# Patient Record
Sex: Female | Born: 1945 | Race: White | Hispanic: No | Marital: Married
Health system: Southern US, Community
[De-identification: ages and names within clinical notes are randomized; demographics above are authoritative.]

## PROBLEM LIST (undated history)

## (undated) DIAGNOSIS — E559 Vitamin D deficiency, unspecified: Secondary | ICD-10-CM

## (undated) DIAGNOSIS — E785 Hyperlipidemia, unspecified: Secondary | ICD-10-CM

## (undated) DIAGNOSIS — M1990 Unspecified osteoarthritis, unspecified site: Secondary | ICD-10-CM

## (undated) DIAGNOSIS — F410 Panic disorder [episodic paroxysmal anxiety] without agoraphobia: Secondary | ICD-10-CM

## (undated) DIAGNOSIS — C801 Malignant (primary) neoplasm, unspecified: Secondary | ICD-10-CM

## (undated) DIAGNOSIS — I10 Essential (primary) hypertension: Secondary | ICD-10-CM

## (undated) DIAGNOSIS — M1712 Unilateral primary osteoarthritis, left knee: Secondary | ICD-10-CM

## (undated) DIAGNOSIS — F32A Depression, unspecified: Secondary | ICD-10-CM

## (undated) DIAGNOSIS — N183 Chronic kidney disease, stage 3 unspecified: Secondary | ICD-10-CM

## (undated) DIAGNOSIS — K219 Gastro-esophageal reflux disease without esophagitis: Secondary | ICD-10-CM

## (undated) HISTORY — PX: TONSILLECTOMY: SUR1361

## (undated) HISTORY — PX: EYE SURGERY: SHX253

## (undated) HISTORY — PX: ABDOMINAL HYSTERECTOMY: SHX81

## (undated) HISTORY — PX: ANKLE SURGERY: SHX546

---
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
## 1968-09-05 HISTORY — PX: ABDOMINAL HYSTERECTOMY: SHX81

## 2003-09-06 DIAGNOSIS — C4490 Unspecified malignant neoplasm of skin, unspecified: Secondary | ICD-10-CM

## 2003-09-06 DIAGNOSIS — C801 Malignant (primary) neoplasm, unspecified: Secondary | ICD-10-CM

## 2003-09-06 HISTORY — DX: Malignant (primary) neoplasm, unspecified: C80.1

## 2003-09-06 HISTORY — DX: Unspecified malignant neoplasm of skin, unspecified: C44.90

## 2004-06-11 ENCOUNTER — Ambulatory Visit: Payer: Self-pay | Admitting: Internal Medicine

## 2005-06-13 ENCOUNTER — Ambulatory Visit: Payer: Self-pay | Admitting: Internal Medicine

## 2005-07-31 ENCOUNTER — Emergency Department: Payer: Self-pay | Admitting: Emergency Medicine

## 2005-07-31 ENCOUNTER — Other Ambulatory Visit: Payer: Self-pay

## 2005-08-03 ENCOUNTER — Inpatient Hospital Stay: Payer: Self-pay | Admitting: Psychiatry

## 2005-08-14 ENCOUNTER — Emergency Department: Payer: Self-pay | Admitting: General Practice

## 2005-08-14 ENCOUNTER — Other Ambulatory Visit: Payer: Self-pay

## 2005-08-19 ENCOUNTER — Other Ambulatory Visit: Payer: Self-pay

## 2005-08-19 ENCOUNTER — Inpatient Hospital Stay: Payer: Self-pay | Admitting: Internal Medicine

## 2005-08-31 ENCOUNTER — Ambulatory Visit: Payer: Self-pay | Admitting: Psychiatry

## 2005-09-05 ENCOUNTER — Ambulatory Visit: Payer: Self-pay | Admitting: Psychiatry

## 2006-06-16 ENCOUNTER — Ambulatory Visit: Payer: Self-pay | Admitting: Internal Medicine

## 2007-03-19 ENCOUNTER — Ambulatory Visit: Payer: Self-pay | Admitting: Psychiatry

## 2007-03-19 ENCOUNTER — Other Ambulatory Visit: Payer: Self-pay

## 2007-06-20 ENCOUNTER — Ambulatory Visit: Payer: Self-pay | Admitting: Internal Medicine

## 2007-09-06 HISTORY — PX: ANKLE SURGERY: SHX546

## 2007-09-06 HISTORY — PX: BUNIONECTOMY: SHX129

## 2008-09-10 ENCOUNTER — Ambulatory Visit: Payer: Self-pay | Admitting: Internal Medicine

## 2009-09-05 HISTORY — PX: BUNIONECTOMY: SHX129

## 2009-09-30 ENCOUNTER — Ambulatory Visit: Payer: Self-pay | Admitting: Internal Medicine

## 2009-11-12 ENCOUNTER — Ambulatory Visit: Payer: Self-pay | Admitting: Podiatry

## 2011-01-25 ENCOUNTER — Ambulatory Visit: Payer: Self-pay | Admitting: Internal Medicine

## 2011-09-06 HISTORY — PX: CATARACT EXTRACTION, BILATERAL: SHX1313

## 2012-01-26 ENCOUNTER — Ambulatory Visit: Payer: Self-pay | Admitting: Internal Medicine

## 2012-07-31 ENCOUNTER — Ambulatory Visit: Payer: Self-pay | Admitting: Ophthalmology

## 2012-08-22 ENCOUNTER — Ambulatory Visit: Payer: Self-pay | Admitting: Ophthalmology

## 2013-02-12 ENCOUNTER — Ambulatory Visit: Payer: Self-pay | Admitting: Internal Medicine

## 2014-02-13 ENCOUNTER — Ambulatory Visit: Payer: Self-pay | Admitting: Internal Medicine

## 2014-12-19 ENCOUNTER — Other Ambulatory Visit: Payer: Self-pay | Admitting: Internal Medicine

## 2014-12-19 DIAGNOSIS — Z1231 Encounter for screening mammogram for malignant neoplasm of breast: Secondary | ICD-10-CM

## 2015-02-16 ENCOUNTER — Other Ambulatory Visit: Payer: Self-pay | Admitting: Internal Medicine

## 2015-02-16 ENCOUNTER — Ambulatory Visit
Admission: RE | Admit: 2015-02-16 | Discharge: 2015-02-16 | Disposition: A | Discharge status: Home / Self Care | Payer: Medicare Other | Source: Ambulatory Visit | Attending: Internal Medicine | Admitting: Internal Medicine

## 2015-02-16 DIAGNOSIS — Z1231 Encounter for screening mammogram for malignant neoplasm of breast: Secondary | ICD-10-CM

## 2015-02-16 HISTORY — DX: Malignant (primary) neoplasm, unspecified: C80.1

## 2015-06-04 ENCOUNTER — Emergency Department
Admission: EM | Admit: 2015-06-04 | Discharge: 2015-06-05 | Disposition: A | Discharge status: Home / Self Care | Payer: Medicare Other | Attending: Emergency Medicine | Admitting: Emergency Medicine

## 2015-06-04 ENCOUNTER — Emergency Department: Payer: Medicare Other

## 2015-06-04 DIAGNOSIS — Z880 Allergy status to penicillin: Secondary | ICD-10-CM | POA: Diagnosis not present

## 2015-06-04 DIAGNOSIS — R1030 Lower abdominal pain, unspecified: Secondary | ICD-10-CM

## 2015-06-04 DIAGNOSIS — K5900 Constipation, unspecified: Secondary | ICD-10-CM | POA: Insufficient documentation

## 2015-06-04 DIAGNOSIS — R197 Diarrhea, unspecified: Secondary | ICD-10-CM | POA: Diagnosis present

## 2015-06-04 HISTORY — DX: Essential (primary) hypertension: I10

## 2015-06-04 HISTORY — DX: Panic disorder (episodic paroxysmal anxiety): F41.0

## 2015-06-04 LAB — COMPREHENSIVE METABOLIC PANEL
ALBUMIN: 4.2 g/dL (ref 3.5–5.0)
ALT: 18 U/L (ref 14–54)
AST: 26 U/L (ref 15–41)
Alkaline Phosphatase: 61 U/L (ref 38–126)
Anion gap: 9 (ref 5–15)
BUN: 10 mg/dL (ref 6–20)
CHLORIDE: 106 mmol/L (ref 101–111)
CO2: 24 mmol/L (ref 22–32)
CREATININE: 1.24 mg/dL — AB (ref 0.44–1.00)
Calcium: 9.5 mg/dL (ref 8.9–10.3)
GFR calc Af Amer: 49 mL/min — ABNORMAL LOW (ref >60)
GFR, EST NON AFRICAN AMERICAN: 43 mL/min — AB (ref >60)
GLUCOSE: 101 mg/dL — AB (ref 65–99)
POTASSIUM: 4.2 mmol/L (ref 3.5–5.1)
Sodium: 139 mmol/L (ref 135–145)
Total Bilirubin: 0.5 mg/dL (ref 0.3–1.2)
Total Protein: 7 g/dL (ref 6.5–8.1)

## 2015-06-04 LAB — CBC WITH DIFFERENTIAL/PLATELET
Basophils Absolute: 0.2 10*3/uL — ABNORMAL HIGH (ref 0–0.1)
Basophils Relative: 2 %
EOS PCT: 6 %
Eosinophils Absolute: 0.5 10*3/uL (ref 0–0.7)
HEMATOCRIT: 45 % (ref 35.0–47.0)
Hemoglobin: 15 g/dL (ref 12.0–16.0)
LYMPHS ABS: 2.2 10*3/uL (ref 1.0–3.6)
LYMPHS PCT: 25 %
MCH: 28 pg (ref 26.0–34.0)
MCHC: 33.3 g/dL (ref 32.0–36.0)
MCV: 84.1 fL (ref 80.0–100.0)
Monocytes Absolute: 0.8 10*3/uL (ref 0.2–0.9)
Monocytes Relative: 9 %
NEUTROS ABS: 5.2 10*3/uL (ref 1.4–6.5)
Neutrophils Relative %: 58 %
PLATELETS: 238 10*3/uL (ref 150–440)
RBC: 5.35 MIL/uL — ABNORMAL HIGH (ref 3.80–5.20)
RDW: 14 % (ref 11.5–14.5)
WBC: 9 10*3/uL (ref 3.6–11.0)

## 2015-06-04 LAB — URINALYSIS COMPLETE WITH MICROSCOPIC (ARMC ONLY)
BILIRUBIN URINE: NEGATIVE
Glucose, UA: NEGATIVE mg/dL
KETONES UR: NEGATIVE mg/dL
Nitrite: NEGATIVE
PH: 6 (ref 5.0–8.0)
Protein, ur: NEGATIVE mg/dL
Specific Gravity, Urine: 1.003 — ABNORMAL LOW (ref 1.005–1.030)

## 2015-06-04 LAB — LIPASE, BLOOD: Lipase: 28 U/L (ref 22–51)

## 2015-06-04 MED ORDER — IOHEXOL 300 MG/ML  SOLN
80.0000 mL | Freq: Once | INTRAMUSCULAR | Status: AC | PRN
  Administered 2015-06-04: 75 mL via INTRAVENOUS

## 2015-06-04 MED ORDER — ACETAMINOPHEN 325 MG PO TABS
650.0000 mg | ORAL_TABLET | Freq: Once | ORAL | Status: AC
  Administered 2015-06-04: 650 mg via ORAL

## 2015-06-04 MED ORDER — ACETAMINOPHEN 325 MG PO TABS
ORAL_TABLET | ORAL | Status: AC
  Filled 2015-06-04: qty 2

## 2015-06-04 MED ORDER — IOHEXOL 240 MG/ML SOLN
25.0000 mL | Freq: Once | INTRAMUSCULAR | Status: AC | PRN
  Administered 2015-06-04: 25 mL via ORAL

## 2015-06-04 NOTE — ED Notes (Signed)
Pt is done drinking contrast and clicked ready for CT

## 2015-06-04 NOTE — ED Provider Notes (Signed)
[facility] Emergency Department Provider Note  Time seen: 6:03 PM  I have reviewed the triage vital signs and the nursing notes.   HISTORY  Chief Complaint Diarrhea    HPI Tabitha Gill is a 69 y.o. female with a past medical history of anxiety presents the emergency department for loose stool in rectal bleeding. According to the patient she has had constipation for the past one week. She states she has been straining and has noticed some blood when she strains. She states she has been using MiraLAX and gave herself an enema. She states she has only had liquid stool without any solid stool produced. Continues to have lower abdominal pain which she states has been ongoing for several months but worse in the last 1 week. Also states some mild dysuria, and difficulty with urination. States her lower abdominal pain is very mild. Worse when she strains to have a bowel movement. Denies any nausea or vomiting. The patient does note decreased appetite for the past one month, with an approximate 10- 15 pound weight loss unintentionally.     Past Medical History  Diagnosis Date  . Cancer     skin 2005  . Panic attack     There are no active problems to display for this patient.   History reviewed. No pertinent past surgical history.  No current outpatient prescriptions on file.  Allergies Penicillins and Valium  Family History  Problem Relation Age of Onset  . Breast cancer Maternal Aunt 55    Social History Social History  Substance Use Topics  . Smoking status: Never Smoker   . Smokeless tobacco: None  . Alcohol Use: No    Review of Systems Constitutional: Negative for fever. Cardiovascular: Negative for chest pain. Respiratory: Negative for shortness of breath. Gastrointestinal: Positive for lower abdominal pain. Loose stool, positive for constipation. Genitourinary: As it for mild dysuria. Difficulty urinating. Neurological: Negative for  headache 10-point ROS otherwise negative.  ____________________________________________   PHYSICAL EXAM:  VITAL SIGNS: ED Triage Vitals  Enc Vitals Group     BP 06/04/15 1633 135/83 mmHg     Pulse Rate 06/04/15 1633 74     Resp 06/04/15 1633 20     Temp 06/04/15 1633 98.5 F (36.9 C)     Temp Source 06/04/15 1633 Oral     SpO2 06/04/15 1633 92 %     Weight 06/04/15 1633 195 lb (88.451 kg)     Height 06/04/15 1633 5\' 6"  (1.676 m)     Head Cir --      Peak Flow --      Pain Score --      Pain Loc --      Pain Edu? --      Excl. in [place]? --     Constitutional: Alert and oriented. Well appearing and in no distress. Eyes: Normal exam ENT   Mouth/Throat: Mucous membranes are moist. Cardiovascular: Normal rate, regular rhythm. No murmur Respiratory: Normal respiratory effort without tachypnea nor retractions. Breath sounds are clear and equal bilaterally. No wheezes/rales/rhonchi. Gastrointestinal: Soft, mild suprapubic and left lower quadrant tenderness palpation. No rebound or guarding. No distention. Musculoskeletal: Nontender with normal range of motion in all extremities. Neurologic:  Normal speech and language. No gross focal neurologic deficits are appreciated. Speech is normal. Skin:  Skin is warm, dry and intact.  Psychiatric: Mood and affect are normal. Speech and behavior are normal. Patient exhibits appropriate insight and judgment.  ____________________________________________  RADIOLOGY  CT largely within normal limits besides stool content.  ____________________________________________   INITIAL IMPRESSION / ASSESSMENT AND PLAN / ED COURSE  Pertinent labs & imaging results that were available during my care of the patient were reviewed by me and considered in my medical decision making (see chart for details).  Patient with 1 week of constipation now on MiraLAX and enemas with liquid stool produced in no solid stool produced. Patient states  worsening lower abdominal pain and is now noted blood in her stool twice upon straining. On rectal exam the patient does have hard stool within the rectum, this was manually disimpacted, stool is light brown but it is slightly guaiac positive. Patient does have moderate sized external hemorrhoids which are possibly the cause of bleeding. Patient states due to her anxiety she has never had a colonoscopy. Given her lower abdominal pain, loose stool/constipation, and unintentional weight loss we will proceed with a CT abdomen and pelvis to further evaluate. Patient has been mainly disimpacted, we'll now attempt a soapsuds enema for symptom relief.  CT consistent with constipation, otherwise within normal limits. Labs largely within normal limits. Positive leuk esterase and urinalysis, but no white blood cells, few bacteria. I will send a urine culture but will refrain from treating at this time. Patient has had several bowel movements since her enema. States she has had quite a bit of solid/"gooey" stool. Patient is to continue MiraLAX for the next 7 days at home once a day. She will follow-up with her primary care physician.  ____________________________________________   FINAL CLINICAL IMPRESSION(S) / ED DIAGNOSES  Constipation lower abdominal pain    Harvest Dark, MD 06/04/15 2227

## 2015-06-04 NOTE — ED Notes (Signed)
Call to lab to follow up on reason that CMP was not performed and to see if tube is in lab to run reordered CMP and lipase

## 2015-06-04 NOTE — ED Notes (Signed)
Soap suds enema administered, with mostly liquid result.

## 2015-06-04 NOTE — ED Notes (Signed)
Pt reports seeing blood in the toilet Monday morning after attempting to do an enema at home. C/o abdominal cramping, and "smears" in her underwear.

## 2015-06-04 NOTE — Discharge Instructions (Signed)
Abdominal Pain, Women °Abdominal (stomach, pelvic, or belly) pain can be caused by many things. It is important to tell your doctor: °· The location of the pain. °· Does it come and go or is it present all the time? °· Are there things that start the pain (eating certain foods, exercise)? °· Are there other symptoms associated with the pain (fever, nausea, vomiting, diarrhea)? °All of this is helpful to know when trying to find the cause of the pain. °CAUSES  °· Stomach: virus or bacteria infection, or ulcer. °· Intestine: appendicitis (inflamed appendix), regional ileitis (Crohn's disease), ulcerative colitis (inflamed colon), irritable bowel syndrome, diverticulitis (inflamed diverticulum of the colon), or cancer of the stomach or intestine. °· Gallbladder disease or stones in the gallbladder. °· Kidney disease, kidney stones, or infection. °· Pancreas infection or cancer. °· Fibromyalgia (pain disorder). °· Diseases of the female organs: °¨ Uterus: fibroid (non-cancerous) tumors or infection. °¨ Fallopian tubes: infection or tubal pregnancy. °¨ Ovary: cysts or tumors. °¨ Pelvic adhesions (scar tissue). °¨ Endometriosis (uterus lining tissue growing in the pelvis and on the pelvic organs). °¨ Pelvic congestion syndrome (female organs filling up with blood just before the menstrual period). °¨ Pain with the menstrual period. °¨ Pain with ovulation (producing an egg). °¨ Pain with an IUD (intrauterine device, birth control) in the uterus. °¨ Cancer of the female organs. °· Functional pain (pain not caused by a disease, may improve without treatment). °· Psychological pain. °· Depression. °DIAGNOSIS  °Your doctor will decide the seriousness of your pain by doing an examination. °· Blood tests. °· X-rays. °· Ultrasound. °· CT scan (computed tomography, special type of X-ray). °· MRI (magnetic resonance imaging). °· Cultures, for infection. °· Barium enema (dye inserted in the large intestine, to better view it with  X-rays). °· Colonoscopy (looking in intestine with a lighted tube). °· Laparoscopy (minor surgery, looking in abdomen with a lighted tube). °· Major abdominal exploratory surgery (looking in abdomen with a large incision). °TREATMENT  °The treatment will depend on the cause of the pain.  °· Many cases can be observed and treated at home. °· Over-the-counter medicines recommended by your caregiver. °· Prescription medicine. °· Antibiotics, for infection. °· Birth control pills, for painful periods or for ovulation pain. °· Hormone treatment, for endometriosis. °· Nerve blocking injections. °· Physical therapy. °· Antidepressants. °· Counseling with a psychologist or psychiatrist. °· Minor or major surgery. °HOME CARE INSTRUCTIONS  °· Do not take laxatives, unless directed by your caregiver. °· Take over-the-counter pain medicine only if ordered by your caregiver. Do not take aspirin because it can cause an upset stomach or bleeding. °· Try a clear liquid diet (broth or water) as ordered by your caregiver. Slowly move to a bland diet, as tolerated, if the pain is related to the stomach or intestine. °· Have a thermometer and take your temperature several times a day, and record it. °· Bed rest and sleep, if it helps the pain. °· Avoid sexual intercourse, if it causes pain. °· Avoid stressful situations. °· Keep your follow-up appointments and tests, as your caregiver orders. °· If the pain does not go away with medicine or surgery, you may try: °¨ Acupuncture. °¨ Relaxation exercises (yoga, meditation). °¨ Group therapy. °¨ Counseling. °SEEK MEDICAL CARE IF:  °· You notice certain foods cause stomach pain. °· Your home care treatment is not helping your pain. °· You need stronger pain medicine. °· You want your IUD removed. °· You feel faint or   lightheaded. °· You develop nausea and vomiting. °· You develop a rash. °· You are having side effects or an allergy to your medicine. °SEEK IMMEDIATE MEDICAL CARE IF:  °· Your  pain does not go away or gets worse. °· You have a fever. °· Your pain is felt only in portions of the abdomen. The right side could possibly be appendicitis. The left lower portion of the abdomen could be colitis or diverticulitis. °· You are passing blood in your stools (bright red or black tarry stools, with or without vomiting). °· You have blood in your urine. °· You develop chills, with or without a fever. °· You pass out. °MAKE SURE YOU:  °· Understand these instructions. °· Will watch your condition. °· Will get help right away if you are not doing well or get worse. °Document Released: 06/19/2007 Document Revised: 01/06/2014 Document Reviewed: 07/09/2009 °Exit[employer]® Patient Information ©2015 [employer]. This information is not intended to replace advice given to you by your health care provider. Make sure you discuss any questions you have with your health care provider. ° °Constipation °Constipation is when a person has fewer than three bowel movements a week, has difficulty having a bowel movement, or has stools that are dry, hard, or larger than normal. As people grow older, constipation is more common. If you try to fix constipation with medicines that make you have a bowel movement (laxatives), the problem may get worse. Long-term laxative use may cause the muscles of the colon to become weak. A low-fiber diet, not taking in enough fluids, and taking certain medicines may make constipation worse.  °CAUSES  °· Certain medicines, such as antidepressants, pain medicine, iron supplements, antacids, and water pills.   °· Certain diseases, such as diabetes, irritable bowel syndrome (IBS), thyroid disease, or depression.   °· Not drinking enough water.   °· Not eating enough fiber-rich foods.   °· Stress or travel.   °· Lack of physical activity or exercise.   °· Ignoring the urge to have a bowel movement.   °· Using laxatives too much.   °SIGNS AND SYMPTOMS  °· Having fewer than three bowel movements a  week.   °· Straining to have a bowel movement.   °· Having stools that are hard, dry, or larger than normal.   °· Feeling full or bloated.   °· Pain in the lower abdomen.   °· Not feeling relief after having a bowel movement.   °DIAGNOSIS  °Your health care provider will take a medical history and perform a physical exam. Further testing may be done for severe constipation. Some tests may include: °· A barium enema X-ray to examine your rectum, colon, and, sometimes, your small intestine.   °· A sigmoidoscopy to examine your lower colon.   °· A colonoscopy to examine your entire colon. °TREATMENT  °Treatment will depend on the severity of your constipation and what is causing it. Some dietary treatments include drinking more fluids and eating more fiber-rich foods. Lifestyle treatments may include regular exercise. If these diet and lifestyle recommendations do not help, your health care provider may recommend taking over-the-counter laxative medicines to help you have bowel movements. Prescription medicines may be prescribed if over-the-counter medicines do not work.  °HOME CARE INSTRUCTIONS  °· Eat foods that have a lot of fiber, such as fruits, vegetables, whole grains, and beans. °· Limit foods high in fat and processed sugars, such as french fries, hamburgers, cookies, candies, and soda.   °· A fiber supplement may be added to your diet if you cannot get enough fiber from foods.   °·   Drink enough fluids to keep your urine clear or pale yellow.   °· Exercise regularly or as directed by your health care provider.   °· Go to the restroom when you have the urge to go. Do not hold it.   °· Only take over-the-counter or prescription medicines as directed by your health care provider. Do not take other medicines for constipation without talking to your health care provider first.   °SEEK IMMEDIATE MEDICAL CARE IF:  °· You have bright red blood in your stool.   °· Your constipation lasts for more than 4 days or gets  worse.   °· You have abdominal or rectal pain.   °· You have thin, pencil-like stools.   °· You have unexplained weight loss. °MAKE SURE YOU:  °· Understand these instructions. °· Will watch your condition. °· Will get help right away if you are not doing well or get worse. °Document Released: 05/20/2004 Document Revised: 08/27/2013 Document Reviewed: 06/03/2013 °ExitCare® Patient Information ©2015 [employer]. This information is not intended to replace advice given to you by your health care provider. Make sure you discuss any questions you have with your health care provider. ° °

## 2015-06-04 NOTE — ED Notes (Signed)
Light green tube was hemolyzed per lab.  will re-draw.

## 2015-06-04 NOTE — ED Notes (Signed)
Patient complains of constipation for 1 week and treated self at home with miralax and fleets enema.  Last dose of OTC medications was yesterday and since last dose patient has had loose stools.  Patient reports abdominal cramping with loose stools.

## 2015-08-11 ENCOUNTER — Other Ambulatory Visit: Payer: Self-pay | Admitting: Physician Assistant

## 2015-08-11 DIAGNOSIS — R1319 Other dysphagia: Secondary | ICD-10-CM

## 2015-08-14 ENCOUNTER — Ambulatory Visit
Admission: RE | Admit: 2015-08-14 | Discharge: 2015-08-14 | Disposition: A | Discharge status: Home / Self Care | Payer: Medicare Other | Source: Ambulatory Visit | Attending: Physician Assistant | Admitting: Physician Assistant

## 2015-08-14 ENCOUNTER — Ambulatory Visit: Payer: Medicare Other

## 2015-08-14 ENCOUNTER — Ambulatory Visit: Admission: RE | Admit: 2015-08-14 | Payer: Medicare Other | Source: Ambulatory Visit

## 2015-08-14 DIAGNOSIS — R1310 Dysphagia, unspecified: Secondary | ICD-10-CM | POA: Diagnosis not present

## 2015-08-14 DIAGNOSIS — K228 Other specified diseases of esophagus: Secondary | ICD-10-CM | POA: Diagnosis not present

## 2015-08-14 DIAGNOSIS — R1319 Other dysphagia: Secondary | ICD-10-CM

## 2015-09-02 ENCOUNTER — Emergency Department
Admission: EM | Admit: 2015-09-02 | Discharge: 2015-09-02 | Disposition: A | Discharge status: Home / Self Care | Payer: Medicare Other | Attending: Emergency Medicine | Admitting: Emergency Medicine

## 2015-09-02 ENCOUNTER — Encounter: Payer: Self-pay | Admitting: Medical Oncology

## 2015-09-02 DIAGNOSIS — Y93E2 Activity, laundry: Secondary | ICD-10-CM | POA: Diagnosis not present

## 2015-09-02 DIAGNOSIS — Y9289 Other specified places as the place of occurrence of the external cause: Secondary | ICD-10-CM | POA: Insufficient documentation

## 2015-09-02 DIAGNOSIS — Y998 Other external cause status: Secondary | ICD-10-CM | POA: Diagnosis not present

## 2015-09-02 DIAGNOSIS — W19XXXA Unspecified fall, initial encounter: Secondary | ICD-10-CM

## 2015-09-02 DIAGNOSIS — S01111A Laceration without foreign body of right eyelid and periocular area, initial encounter: Secondary | ICD-10-CM | POA: Diagnosis present

## 2015-09-02 DIAGNOSIS — W01198A Fall on same level from slipping, tripping and stumbling with subsequent striking against other object, initial encounter: Secondary | ICD-10-CM | POA: Insufficient documentation

## 2015-09-02 DIAGNOSIS — I10 Essential (primary) hypertension: Secondary | ICD-10-CM | POA: Insufficient documentation

## 2015-09-02 DIAGNOSIS — Z880 Allergy status to penicillin: Secondary | ICD-10-CM | POA: Diagnosis not present

## 2015-09-02 DIAGNOSIS — S0181XA Laceration without foreign body of other part of head, initial encounter: Secondary | ICD-10-CM

## 2015-09-02 DIAGNOSIS — S0990XA Unspecified injury of head, initial encounter: Secondary | ICD-10-CM | POA: Insufficient documentation

## 2015-09-02 NOTE — ED Notes (Signed)
Pt reports she was in the laundry room this am when she lifted her head into cabinet and has lac to rt eye brow. Pt then fell and hit left side of her head. Pt in NAD. Denies use of blood thinner, reports 81mg  ASA daily. A/o x 4. Reports headache.

## 2015-09-02 NOTE — ED Provider Notes (Signed)
[facility] Emergency Department Provider Note  ____________________________________________  Time seen: Approximately 4:33 PM  I have reviewed the triage vital signs and the nursing notes.   HISTORY  Chief Complaint Head Injury and Headache    HPI Tabitha Gill is a 69 y.o. female who presents emergency department status post fall. She states that she was doing laundry in her laundry room when she hit her head on an open door. She states that it "staggered" her and she fell hitting the left side of her face against a dryer on the way to the floor. Patient denies any loss of consciousness. She does endorse taking daily aspirin but denies any other blood thinner use. She endorses a mild right-sided headache but denies any visual acuity changes, neck pain, chest pain, shortness of breath, nausea or vomiting, numbness or tingling. Patient endorses a small laceration above right eye that she was able control bleeding at home.   Past Medical History  Diagnosis Date  . Cancer ([place])     skin 2005  . Panic attack   . Hypertension     There are no active problems to display for this patient.   Past Surgical History  Procedure Laterality Date  . Eye surgery    . Ankle surgery    . Abdominal hysterectomy      No current outpatient prescriptions on file.  Allergies Penicillins and Valium  Family History  Problem Relation Age of Onset  . Breast cancer Maternal Aunt 55    Social History Social History  Substance Use Topics  . Smoking status: Never Smoker   . Smokeless tobacco: None  . Alcohol Use: No    Review of Systems Constitutional: No fever/chills Eyes: No visual changes. ENT: No sore throat. Cardiovascular: Denies chest pain. Respiratory: Denies shortness of breath. Gastrointestinal: No abdominal pain.  No nausea, no vomiting.  No diarrhea.  No constipation. Genitourinary: Negative for dysuria. Musculoskeletal: Negative for back  pain. Skin: Negative for rash. Nurse's laceration above right eye. Neurological: Endorses a headache but denies focal weakness or numbness.  10-point ROS otherwise negative.  ____________________________________________   PHYSICAL EXAM:  VITAL SIGNS: ED Triage Vitals  Enc Vitals Group     BP 09/02/15 1455 133/72 mmHg     Pulse Rate 09/02/15 1453 75     Resp 09/02/15 1453 18     Temp 09/02/15 1453 98.1 F (36.7 C)     Temp Source 09/02/15 1453 Oral     SpO2 09/02/15 1453 96 %     Weight 09/02/15 1453 190 lb (86.183 kg)     Height 09/02/15 1453 5\' 6"  (1.676 m)     Head Cir --      Peak Flow --      Pain Score 09/02/15 1453 8     Pain Loc --      Pain Edu? --      Excl. in [place]? --     Constitutional: Alert and oriented. Well appearing and in no acute distress. Eyes: Conjunctivae are normal. PERRL. EOMI. Head: Atraumatic. Nose: No congestion/rhinnorhea. Mouth/Throat: Mucous membranes are moist.  Oropharynx non-erythematous. Neck: No stridor.  No cervical spine tenderness to palpation. Cardiovascular: Normal rate, regular rhythm. Grossly normal heart sounds.  Good peripheral circulation. Respiratory: Normal respiratory effort.  No retractions. Lungs CTAB. Gastrointestinal: Soft and nontender. No distention. No abdominal bruits. No CVA tenderness. Musculoskeletal: No lower extremity tenderness nor edema.  No joint effusions. Neurologic:  Normal speech and language. No  gross focal neurologic deficits are appreciated. No gait instability. Cranial nerves II through XII grossly intact. Sensation intact and equal all 4 extremities. Skin:  Skin is warm, dry and intact. No rash noted. Laceration noted lateral eyebrow. 0.5 cm in length. Edges are clean and well approximated. No bleeding. No visible foreign body. Psychiatric: Mood and affect are normal. Speech and behavior are normal.  ____________________________________________   LABS (all labs ordered are listed, but only abnormal  results are displayed)  Labs Reviewed - No data to display ____________________________________________  EKG   ____________________________________________  RADIOLOGY   ____________________________________________   PROCEDURES  Procedure(s) performed: yes, laceration repair, see procedure note(s).    LACERATION REPAIR Performed by: Darletta Moll Authorized by: Charline Bills Shakala Marlatt Consent: Verbal consent obtained. Risks and benefits: risks, benefits and alternatives were discussed Consent given by: patient Patient identity confirmed: provided demographic data Prepped and Draped in normal sterile fashion Wound explored  Laceration Location: Right lateral eyebrow.  Laceration Length: 0.5 cm  No Foreign Bodies seen or palpated  Irrigation method: syringe Amount of cleaning: standard  Skin closure: Dermabond   Technique: Area was thoroughly irrigated and cleansed with no visible foreign body. No return of bleeding. Edges were approximated and closed using Dermabond. Patient tolerated procedure well.   Patient tolerance: Patient tolerated the procedure well with no immediate complications.   Critical Care performed: No  ____________________________________________   INITIAL IMPRESSION / ASSESSMENT AND PLAN / ED COURSE  Pertinent labs & imaging results that were available during my care of the patient were reviewed by me and considered in my medical decision making (see chart for details).  Patient presents emergency Department with a small laceration above right eye. She also endorses falling and hitting her head. Patient is neurologically intact. Cranial nerves are grossly intact. Patient does endorse a mild headache. This time no CT is performed. Laceration was closed using Dermabond and patient tolerated procedure well. Patient is discharged home with strict ED precautions to return for any worsening of symptoms. Patient verbalizes understanding of  diagnosis and treatment plan and verbalizes compliance with same. ____________________________________________   FINAL CLINICAL IMPRESSION(S) / ED DIAGNOSES  Final diagnoses:  Fall, initial encounter  Facial laceration, initial encounter      Darletta Moll, PA-C 09/02/15 1638  Hinda Kehr, MD 09/03/15 [id number]

## 2015-09-02 NOTE — Discharge Instructions (Signed)
Facial Laceration ° A facial laceration is a cut on the face. These injuries can be painful and cause bleeding. Lacerations usually heal quickly, but they need special care to reduce scarring. °DIAGNOSIS  °Your health care provider will take a medical history, ask for details about how the injury occurred, and examine the wound to determine how deep the cut is. °TREATMENT  °Some facial lacerations may not require closure. Others may not be able to be closed because of an increased risk of infection. The risk of infection and the chance for successful closure will depend on various factors, including the amount of time since the injury occurred. °The wound may be cleaned to help prevent infection. If closure is appropriate, pain medicines may be given if needed. Your health care provider will use stitches (sutures), wound glue (adhesive), or skin adhesive strips to repair the laceration. These tools bring the skin edges together to allow for faster healing and a better cosmetic outcome. If needed, you may also be given a tetanus shot. °HOME CARE INSTRUCTIONS °· Only take over-the-counter or prescription medicines as directed by your health care provider. °· Follow your health care provider's instructions for wound care. These instructions will vary depending on the technique used for closing the wound. °For Sutures: °· Keep the wound clean and dry.   °· If you were given a bandage (dressing), you should change it at least once a day. Also change the dressing if it becomes wet or dirty, or as directed by your health care provider.   °· Wash the wound with soap and water 2 times a day. Rinse the wound off with water to remove all soap. Pat the wound dry with a clean towel.   °· After cleaning, apply a thin layer of the antibiotic ointment recommended by your health care provider. This will help prevent infection and keep the dressing from sticking.   °· You may shower as usual after the first 24 hours. Do not soak the  wound in water until the sutures are removed.   °· Get your sutures removed as directed by your health care provider. With facial lacerations, sutures should usually be taken out after 4-5 days to avoid stitch marks.   °· Wait a few days after your sutures are removed before applying any makeup. °For Skin Adhesive Strips: °· Keep the wound clean and dry.   °· Do not get the skin adhesive strips wet. You may bathe carefully, using caution to keep the wound dry.   °· If the wound gets wet, pat it dry with a clean towel.   °· Skin adhesive strips will fall off on their own. You may trim the strips as the wound heals. Do not remove skin adhesive strips that are still stuck to the wound. They will fall off in time.   °For Wound Adhesive: °· You may briefly wet your wound in the shower or bath. Do not soak or scrub the wound. Do not swim. Avoid periods of heavy sweating until the skin adhesive has fallen off on its own. After showering or bathing, gently pat the wound dry with a clean towel.   °· Do not apply liquid medicine, cream medicine, ointment medicine, or makeup to your wound while the skin adhesive is in place. This may loosen the film before your wound is healed.   °· If a dressing is placed over the wound, be careful not to apply tape directly over the skin adhesive. This may cause the adhesive to be pulled off before the wound is healed.   °· Avoid   prolonged exposure to sunlight or tanning lamps while the skin adhesive is in place. °· The skin adhesive will usually remain in place for 5-10 days, then naturally fall off the skin. Do not pick at the adhesive film.   °After Healing: °Once the wound has healed, cover the wound with sunscreen during the day for 1 full year. This can help minimize scarring. Exposure to ultraviolet light in the first year will darken the scar. It can take 1-2 years for the scar to lose its redness and to heal completely.  °SEEK MEDICAL CARE IF: °· You have a fever. °SEEK IMMEDIATE  MEDICAL CARE IF: °· You have redness, pain, or swelling around the wound.   °· You see a yellowish-white fluid (pus) coming from the wound.   °  °This information is not intended to replace advice given to you by your health care provider. Make sure you discuss any questions you have with your health care provider. °  °Document Released: 09/29/2004 Document Revised: 09/12/2014 Document Reviewed: 04/04/2013 °Elsevier Interactive Patient Education ©2016 [employer] ° °

## 2015-10-16 DIAGNOSIS — E559 Vitamin D deficiency, unspecified: Secondary | ICD-10-CM | POA: Insufficient documentation

## 2015-10-28 ENCOUNTER — Other Ambulatory Visit: Payer: Self-pay | Admitting: Internal Medicine

## 2015-10-28 DIAGNOSIS — Z1231 Encounter for screening mammogram for malignant neoplasm of breast: Secondary | ICD-10-CM

## 2016-02-17 ENCOUNTER — Other Ambulatory Visit: Payer: Self-pay | Admitting: Internal Medicine

## 2016-02-17 ENCOUNTER — Ambulatory Visit
Admission: RE | Admit: 2016-02-17 | Discharge: 2016-02-17 | Disposition: A | Discharge status: Home / Self Care | Payer: Medicare Other | Source: Ambulatory Visit | Attending: Internal Medicine | Admitting: Internal Medicine

## 2016-02-17 DIAGNOSIS — Z1231 Encounter for screening mammogram for malignant neoplasm of breast: Secondary | ICD-10-CM

## 2016-10-04 DIAGNOSIS — K219 Gastro-esophageal reflux disease without esophagitis: Secondary | ICD-10-CM | POA: Insufficient documentation

## 2016-11-17 ENCOUNTER — Other Ambulatory Visit: Payer: Self-pay | Admitting: Internal Medicine

## 2016-11-17 DIAGNOSIS — Z1231 Encounter for screening mammogram for malignant neoplasm of breast: Secondary | ICD-10-CM

## 2017-02-17 ENCOUNTER — Ambulatory Visit
Admission: RE | Admit: 2017-02-17 | Discharge: 2017-02-17 | Disposition: A | Discharge status: Home / Self Care | Payer: Medicare Other | Source: Ambulatory Visit | Attending: Internal Medicine | Admitting: Internal Medicine

## 2017-02-17 DIAGNOSIS — Z1231 Encounter for screening mammogram for malignant neoplasm of breast: Secondary | ICD-10-CM | POA: Diagnosis not present

## 2017-05-31 ENCOUNTER — Emergency Department: Payer: Medicare Other

## 2017-05-31 ENCOUNTER — Encounter: Payer: Self-pay | Admitting: Medical Oncology

## 2017-05-31 ENCOUNTER — Emergency Department
Admission: EM | Admit: 2017-05-31 | Discharge: 2017-05-31 | Disposition: A | Discharge status: Home / Self Care | Payer: Medicare Other | Attending: Emergency Medicine | Admitting: Emergency Medicine

## 2017-05-31 DIAGNOSIS — I10 Essential (primary) hypertension: Secondary | ICD-10-CM | POA: Insufficient documentation

## 2017-05-31 DIAGNOSIS — Z79899 Other long term (current) drug therapy: Secondary | ICD-10-CM | POA: Diagnosis not present

## 2017-05-31 DIAGNOSIS — R1084 Generalized abdominal pain: Secondary | ICD-10-CM

## 2017-05-31 DIAGNOSIS — K5641 Fecal impaction: Secondary | ICD-10-CM

## 2017-05-31 DIAGNOSIS — R194 Change in bowel habit: Secondary | ICD-10-CM

## 2017-05-31 DIAGNOSIS — K625 Hemorrhage of anus and rectum: Secondary | ICD-10-CM | POA: Diagnosis present

## 2017-05-31 LAB — COMPREHENSIVE METABOLIC PANEL
ALK PHOS: 62 U/L (ref 38–126)
ALT: 37 U/L (ref 14–54)
ANION GAP: 9 (ref 5–15)
AST: 66 U/L — ABNORMAL HIGH (ref 15–41)
Albumin: 3.9 g/dL (ref 3.5–5.0)
BUN: 9 mg/dL (ref 6–20)
CALCIUM: 9.6 mg/dL (ref 8.9–10.3)
CO2: 24 mmol/L (ref 22–32)
Chloride: 106 mmol/L (ref 101–111)
Creatinine, Ser: 1.22 mg/dL — ABNORMAL HIGH (ref 0.44–1.00)
GFR, EST AFRICAN AMERICAN: 50 mL/min — AB (ref >60)
GFR, EST NON AFRICAN AMERICAN: 43 mL/min — AB (ref >60)
Glucose, Bld: 136 mg/dL — ABNORMAL HIGH (ref 65–99)
Potassium: 3.6 mmol/L (ref 3.5–5.1)
SODIUM: 139 mmol/L (ref 135–145)
TOTAL PROTEIN: 7 g/dL (ref 6.5–8.1)
Total Bilirubin: 0.6 mg/dL (ref 0.3–1.2)

## 2017-05-31 LAB — CBC
HCT: 46.1 % (ref 35.0–47.0)
HEMOGLOBIN: 15.5 g/dL (ref 12.0–16.0)
MCH: 27.9 pg (ref 26.0–34.0)
MCHC: 33.7 g/dL (ref 32.0–36.0)
MCV: 82.9 fL (ref 80.0–100.0)
Platelets: 226 10*3/uL (ref 150–440)
RBC: 5.56 MIL/uL — AB (ref 3.80–5.20)
RDW: 14.7 % — ABNORMAL HIGH (ref 11.5–14.5)
WBC: 6.1 10*3/uL (ref 3.6–11.0)

## 2017-05-31 MED ORDER — IOPAMIDOL (ISOVUE-300) INJECTION 61%
100.0000 mL | Freq: Once | INTRAVENOUS | Status: AC | PRN
  Administered 2017-05-31: 75 mL via INTRAVENOUS

## 2017-05-31 MED ORDER — BISACODYL 10 MG RE SUPP: 10.0000 mg | RECTAL | 0 refills | Status: DC | PRN

## 2017-05-31 MED ORDER — SENNOSIDES-DOCUSATE SODIUM 8.6-50 MG PO TABS: 2.0000 | ORAL_TABLET | Freq: Two times a day (BID) | ORAL | 0 refills | Status: DC

## 2017-05-31 MED ORDER — IOPAMIDOL (ISOVUE-300) INJECTION 61%: 15.0000 mL | INTRAVENOUS | Status: DC

## 2017-05-31 MED ORDER — IOPAMIDOL (ISOVUE-300) INJECTION 61%
30.0000 mL | Freq: Once | INTRAVENOUS | Status: AC | PRN
  Administered 2017-05-31: 30 mL via ORAL

## 2017-05-31 NOTE — ED Provider Notes (Signed)
[facility] Emergency Department Provider Note  ____________________________________________  Time seen: Approximately 9:43 AM  I have reviewed the triage vital signs and the nursing notes.   HISTORY  Chief Complaint Rectal Bleeding    HPI Tabitha Gill is a 71 y.o. female who complains of dark stools with occasional red blood for the past 5 days. Has lower abdominal pain which is constant waxing and waning, bilateral lower quadrants, nonradiating, no aggravating or alleviating factors. When symptoms first started she tried milk of magnesia which did not help. She's tried Imodium which has slowed the output, but now she reports that she feels like her rectum doesn't close up and she is leaking stool every time she stands. Denies any recent steroid or NSAID use. Denies any history of GI bleeding or peptic ulcers. no blood thinner use    Past Medical History:  Diagnosis Date  . Cancer ([place])    skin 2005  . Hypertension   . Panic attack      There are no active problems to display for this patient.    Past Surgical History:  Procedure Laterality Date  . ABDOMINAL HYSTERECTOMY    . ANKLE SURGERY    . EYE SURGERY       Prior to Admission medications   Medication Sig Start Date End Date Taking? Authorizing Provider  amLODipine (NORVASC) 5 MG tablet Take 5 mg by mouth daily. 11/23/16 11/23/17 Yes [provider]  ciprofloxacin (CIPRO) 500 MG tablet Take 500 mg by mouth 2 (two) times daily. 05/26/17 06/02/17 Yes [provider]  clonazePAM (KLONOPIN) 0.5 MG tablet Take 0.25 mg by mouth 2 (two) times daily.   Yes [provider]  FLUoxetine (PROZAC) 20 MG capsule Take 20 mg by mouth daily.   Yes [provider]  lamoTRIgine (LAMICTAL) 100 MG tablet Take 100-150 mg by mouth 2 (two) times daily.    Yes [provider]  mirtazapine (REMERON SOL-TAB) 30 MG disintegrating tablet Take 30 mg by mouth at bedtime.  05/02/16  Yes [provider]  bisacodyl (DULCOLAX) 10 MG suppository Place 1 suppository (10 mg total) rectally as needed for moderate constipation. 05/31/17   Carrie Mew, MD  carboxymethylcellulose 1 % ophthalmic solution Apply 1 drop to eye daily as needed.    [provider]  senna-docusate (SENOKOT-S) 8.6-50 MG tablet Take 2 tablets by mouth 2 (two) times daily. 05/31/17   Carrie Mew, MD     Allergies Cymbalta [duloxetine hcl]; Penicillins; and Valium [diazepam]   Family History  Problem Relation Age of Onset  . Breast cancer Maternal Aunt 55    Social History Social History  Substance Use Topics  . Smoking status: Never Smoker  . Smokeless tobacco: Not on file  . Alcohol use No    Review of Systems  Constitutional:   No fever or chills.  ENT:   No sore throat. No rhinorrhea. Cardiovascular:   No chest pain or syncope. Respiratory:   No dyspnea or cough. Gastrointestinal:   positive as above for abdominal pain and rectal bleeding. No vomiting or diarrhea..  Musculoskeletal:   Negative for focal pain or swelling All other systems reviewed and are negative except as documented above in ROS and HPI.  ____________________________________________   PHYSICAL EXAM:  VITAL SIGNS: ED Triage Vitals  Enc Vitals Group     BP 05/31/17 0827 130/70     Pulse Rate 05/31/17 0825 (!) 102     Resp 05/31/17 0825 18  Temp 05/31/17 0825 98.1 F (36.7 C)     Temp Source 05/31/17 0825 Oral     SpO2 05/31/17 0825 96 %     Weight 05/31/17 0826 200 lb (90.7 kg)     Height 05/31/17 0826 5\' 6"  (1.676 m)     Head Circumference --      Peak Flow --      Pain Score 05/31/17 0825 9     Pain Loc --      Pain Edu? --      Excl. in [place]? --     Vital signs reviewed, nursing assessments reviewed.   Constitutional:   Alert and oriented. Well appearing and in no distress. Eyes:   No scleral icterus.  EOMI. No nystagmus. No conjunctival pallor.  PERRL. ENT   Head:   Normocephalic and atraumatic.   Nose:   No congestion/rhinnorhea.    Mouth/Throat:   MMM, no pharyngeal erythema. No peritonsillar mass.    Neck:   No meningismus. Full ROM Hematological/Lymphatic/Immunilogical:   No cervical lymphadenopathy. Cardiovascular:   RRR. Symmetric bilateral radial and DP pulses.  No murmurs.  Respiratory:   Normal respiratory effort without tachypnea/retractions. Breath sounds are clear and equal bilaterally. No wheezes/rales/rhonchi. Gastrointestinal:   Soft with bilateral mid abdominal tenderness, left greater than right.. Non distended. There is no CVA tenderness.  No rebound, rigidity, or guarding.rectal exam reveals a small external hemorrhoid. Brown stool, Hemoccult negative, controls okay. There is a large amount of hard stool with fecal impaction in the rectum. This was manually disimpacted. Genitourinary:   deferred Musculoskeletal:   Normal range of motion in all extremities. No joint effusions.  No lower extremity tenderness.  No edema. Neurologic:   Normal speech and language.  Motor grossly intact. No gross focal neurologic deficits are appreciated.  Skin:    Skin is warm, dry and intact. No rash noted.  No petechiae, purpura, or bullae.  ____________________________________________    LABS (pertinent positives/negatives) (all labs ordered are listed, but only abnormal results are displayed) Labs Reviewed  COMPREHENSIVE METABOLIC PANEL - Abnormal; Notable for the following:       Result Value   Glucose, Bld 136 (*)    Creatinine, Ser 1.22 (*)    AST 66 (*)    GFR calc non Af Amer 43 (*)    GFR calc Af Amer 50 (*)    All other components within normal limits  CBC - Abnormal; Notable for the following:    RBC 5.56 (*)    RDW 14.7 (*)    All other components within normal limits  POC OCCULT BLOOD, ED   ____________________________________________   EKG  interpreted by me Normal sinus rhythm rate of 92,  left axis, normal intervals. Incomplete right bundle-branch block. Q waves in high lateral leads. Voltage criteria for LVH in the high lateral leads. No acute ischemic changes. No atrial fibrillation  ____________________________________________    RADIOLOGY  Ct Abdomen Pelvis W Contrast  Result Date: 05/31/2017 CLINICAL DATA:  Abdominal pain. Gastritis or colitis suspected. Constipation. Blood in stools. Prior hysterectomy. EXAM: CT ABDOMEN AND PELVIS WITH CONTRAST TECHNIQUE: Multidetector CT imaging of the abdomen and pelvis was performed using the standard protocol following bolus administration of intravenous contrast. CONTRAST:  63mL ISOVUE-300 IOPAMIDOL (ISOVUE-300) INJECTION 61% COMPARISON:  06/04/2015 FINDINGS: Lower chest: Bibasilar scarring. Mild cardiomegaly, without pericardial or pleural effusion. Hepatobiliary: Caudate and lateral segment left liver lobe prominence. Subtle irregular hepatic capsule, including on image 30/series 2. 2.0 cm gallstone  without acute cholecystitis or biliary duct dilatation. Minimal motion degradation throughout the abdomen and upper pelvis. Pancreas: Normal, without mass or ductal dilatation. Spleen: Normal in size, without focal abnormality. Adrenals/Urinary Tract: Normal adrenal glands. Normal left kidney. Mild right-sided hydronephrosis with a right extrarenal pelvis. No hydroureter. Minimal air in the nondependent bladder. Stomach/Bowel: Normal stomach, without wall thickening. Large amount of stool in the rectum. Subtle perirectal edema is identified including on image 72/series 2. Scattered colonic diverticula. Normal terminal ileum. Normal small bowel. Vascular/Lymphatic: Aortic and branch vessel atherosclerosis. Patent mesenteric vessels. No portal venous thrombus. No specific evidence of portal venous hypertension. No abdominopelvic adenopathy. Reproductive: Hysterectomy.  No adnexal mass. Other: No significant free fluid.  No free intraperitoneal air.  Musculoskeletal: Right hip osteoarthritis. Lumbosacral spondylosis with S-shaped thoracolumbar spine curvature. IMPRESSION: 1. Large amount of stool in the rectum suggests constipation or fecal impaction. Mild surrounding edema likely represents secondary proctitis. 2. Mild motion degradation. 3. Cholelithiasis. 4. Mild right-sided hydronephrosis without hydroureter. 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 5.  Aortic Atherosclerosis (ICD10-I70.0). 6. Air in the urinary bladder.  Correlate with instrumentation. 7. Suspect mild cirrhosis. Electronically Signed   By: Abigail Miyamoto M.D.   On: 05/31/2017 11:17    ____________________________________________   PROCEDURES Procedures ------------------------------------------------------------------------------------------------------------------- Fecal Disimpaction Procedure Note:  Performed by me:  Patient placed in the lateral recumbent position with knees drawn towards chest. Large amount of hard brown stool removed. No complications during procedure.   ------------------------------------------------------------------------------------------------------------------   ____________________________________________   INITIAL IMPRESSION / ASSESSMENT AND PLAN / ED COURSE  Pertinent labs & imaging results that were available during my care of the patient were reviewed by me and considered in my medical decision making (see chart for details).  patient well appearing no acute distress, vital signs stable, presents with abdominal pain and tenderness. Does have a fecal impaction which was manually disimpacted. This may be the cause of all her symptoms. There is no current evidence of GI bleeding. Her primary care doctor had wanted to get a CT scan which we will perform today. Labs are stable, if CT does not show any severe findings patient will be stable for follow-up with gastroenterology and primary care. Current differential includes  diverticulitis and colitis. Low suspicion for mesenteric ischemia AAA dissection or appendicitis cholecystitis pancreatitis perforation or obstruction.   ----------------------------------------- 2:29 PM on 05/31/2017 -----------------------------------------  CT negative except for large stool burden and suggest him a fecal impaction. She is artery taken manually disimpacted, vital signs unremarkable, symptoms are controlled. Dulcolax suppository, Senokot, follow-up with primary care.     ____________________________________________   FINAL CLINICAL IMPRESSION(S) / ED DIAGNOSES  Final diagnoses:  Fecal impaction ([facility])  Generalized abdominal pain      New Prescriptions   BISACODYL (DULCOLAX) 10 MG SUPPOSITORY    Place 1 suppository (10 mg total) rectally as needed for moderate constipation.   SENNA-DOCUSATE (SENOKOT-S) 8.6-50 MG TABLET    Take 2 tablets by mouth 2 (two) times daily.     Portions of this note were generated with dragon dictation software. Dictation errors may occur despite best attempts at proofreading.    Carrie Mew, MD 05/31/17 1430

## 2017-05-31 NOTE — ED Notes (Signed)
Pt returned from CT °

## 2017-05-31 NOTE — ED Triage Notes (Signed)
Pt reports she began having dark stools Friday, since then pt has continued to have dark stools with bright red "leaking" from rectum. Pt reports lower abd pain also. Seen by pcp yesterday. NAD noted. VSS

## 2017-05-31 NOTE — Progress Notes (Signed)
Chaplain rounding unit visited with pt. [place] met pt and husband at bedside. Pt talked about how kind her husband was, she spoke about her marriage and struggles with poor. [place] validated pt's feelings and provided encouragement, prayers and pastoral presence. Pt was be discharged this PM.   05/31/17 1400  Clinical Encounter Type  Visited With Patient and family together  Visit Type Initial;Spiritual support;ED  Referral From [place]

## 2017-11-29 DIAGNOSIS — N183 Chronic kidney disease, stage 3 unspecified: Secondary | ICD-10-CM | POA: Insufficient documentation

## 2017-12-18 ENCOUNTER — Other Ambulatory Visit: Payer: Self-pay | Admitting: Internal Medicine

## 2017-12-18 DIAGNOSIS — Z1231 Encounter for screening mammogram for malignant neoplasm of breast: Secondary | ICD-10-CM

## 2018-01-02 ENCOUNTER — Encounter: Payer: Self-pay | Admitting: Emergency Medicine

## 2018-01-02 ENCOUNTER — Other Ambulatory Visit: Payer: Self-pay

## 2018-01-02 ENCOUNTER — Emergency Department
Admission: EM | Admit: 2018-01-02 | Discharge: 2018-01-02 | Disposition: A | Discharge status: Home / Self Care | Payer: Medicare Other | Attending: Emergency Medicine | Admitting: Emergency Medicine

## 2018-01-02 ENCOUNTER — Emergency Department: Payer: Medicare Other

## 2018-01-02 DIAGNOSIS — Z85828 Personal history of other malignant neoplasm of skin: Secondary | ICD-10-CM | POA: Insufficient documentation

## 2018-01-02 DIAGNOSIS — R05 Cough: Secondary | ICD-10-CM | POA: Diagnosis present

## 2018-01-02 DIAGNOSIS — I10 Essential (primary) hypertension: Secondary | ICD-10-CM | POA: Diagnosis not present

## 2018-01-02 DIAGNOSIS — J209 Acute bronchitis, unspecified: Secondary | ICD-10-CM | POA: Insufficient documentation

## 2018-01-02 DIAGNOSIS — Z79899 Other long term (current) drug therapy: Secondary | ICD-10-CM | POA: Insufficient documentation

## 2018-01-02 MED ORDER — PREDNISONE 10 MG PO TABS: ORAL_TABLET | ORAL | 0 refills | Status: DC

## 2018-01-02 MED ORDER — SULFAMETHOXAZOLE-TRIMETHOPRIM 400-80 MG PO TABS: 2.0000 | ORAL_TABLET | Freq: Two times a day (BID) | ORAL | 0 refills | Status: DC

## 2018-01-02 MED ORDER — BENZONATATE 100 MG PO CAPS: ORAL_CAPSULE | ORAL | 0 refills | Status: DC

## 2018-01-02 MED ORDER — IPRATROPIUM-ALBUTEROL 0.5-2.5 (3) MG/3ML IN SOLN
3.0000 mL | Freq: Once | RESPIRATORY_TRACT | Status: AC
  Administered 2018-01-02: 3 mL via RESPIRATORY_TRACT
  Filled 2018-01-02: qty 3

## 2018-01-02 MED ORDER — SULFAMETHOXAZOLE-TRIMETHOPRIM 800-160 MG PO TABS: 1.0000 | ORAL_TABLET | Freq: Two times a day (BID) | ORAL | 0 refills | Status: DC

## 2018-01-02 NOTE — ED Provider Notes (Signed)
[facility] Emergency Department Provider Note  ___________________________________________   First MD Initiated Contact with Patient 01/02/18 1427     (approximate)  I have reviewed the triage vital signs and the nursing notes.   HISTORY  Chief Complaint URI and Cough  HPI Tabitha Gill is a 72 y.o. female is brought over to the emergency room from [facility] acute care with complaint of shortness of breath for the last 2 weeks and nurse states in triage that she was told her O2 sats were 91 in the clinic.  Patient states that she has a yellow productive cough along with nasal congestion and sore throat.  Patient denies any previous COPD or bronchitis issues.   Past Medical History:  Diagnosis Date  . Cancer ([place])    skin 2005  . Hypertension   . Panic attack     There are no active problems to display for this patient.   Past Surgical History:  Procedure Laterality Date  . ABDOMINAL HYSTERECTOMY    . ANKLE SURGERY    . EYE SURGERY    . TONSILLECTOMY      Prior to Admission medications   Medication Sig Start Date End Date Taking? Authorizing Provider  amLODipine (NORVASC) 5 MG tablet Take 5 mg by mouth daily. 11/23/16 11/23/17  [provider]  benzonatate (TESSALON PERLES) 100 MG capsule 1 or 2 every 8 hours as needed for cough. 01/02/18   Johnn Hai, PA-C  bisacodyl (DULCOLAX) 10 MG suppository Place 1 suppository (10 mg total) rectally as needed for moderate constipation. 05/31/17   Carrie Mew, MD  carboxymethylcellulose 1 % ophthalmic solution Apply 1 drop to eye daily as needed.    [provider]  clonazePAM (KLONOPIN) 0.5 MG tablet Take 0.25 mg by mouth 2 (two) times daily.    [provider]  FLUoxetine (PROZAC) 20 MG capsule Take 20 mg by mouth daily.    [provider]  lamoTRIgine (LAMICTAL) 100 MG tablet Take 100-150 mg by mouth 2 (two) times daily.     [provider]    mirtazapine (REMERON SOL-TAB) 30 MG disintegrating tablet Take 30 mg by mouth at bedtime. 05/02/16   [provider]  predniSONE (DELTASONE) 10 MG tablet Take 3 tablets once a day for 3 days 01/02/18   Johnn Hai, PA-C  senna-docusate (SENOKOT-S) 8.6-50 MG tablet Take 2 tablets by mouth 2 (two) times daily. 05/31/17   Carrie Mew, MD  sulfamethoxazole-trimethoprim (BACTRIM DS,SEPTRA DS) 800-160 MG tablet Take 1 tablet by mouth 2 (two) times daily. 01/02/18   Johnn Hai, PA-C  sulfamethoxazole-trimethoprim (BACTRIM,SEPTRA) 400-80 MG tablet Take 2 tablets by mouth 2 (two) times daily. 01/02/18   Johnn Hai, PA-C    Allergies Cymbalta [duloxetine hcl]; Penicillins; Sumycin [tetracycline]; and Valium [diazepam]  Family History  Problem Relation Age of Onset  . Breast cancer Maternal Aunt 40    Social History Social History   Tobacco Use  . Smoking status: Never Smoker  . Smokeless tobacco: Never Used  Substance Use Topics  . Alcohol use: No  . Drug use: No    Review of Systems Constitutional: No fever/chills Eyes: No visual changes. ENT: No sore throat.  Mild nasal congestion present. Cardiovascular: Denies chest pain. Respiratory: Denies shortness of breath.  Productive cough. Gastrointestinal: No abdominal pain.  No nausea, no vomiting.   Genitourinary: Negative for dysuria.  Musculoskeletal: Negative for back pain. Skin: Negative for rash. Neurological: Negative for headaches,  focal weakness or numbness. ____________________________________________   PHYSICAL EXAM:  VITAL SIGNS: ED Triage Vitals  Enc Vitals Group     BP 01/02/18 1359 131/77     Pulse Rate 01/02/18 1359 97     Resp 01/02/18 1359 16     Temp 01/02/18 1359 99.3 F (37.4 C)     Temp Source 01/02/18 1359 Oral     SpO2 01/02/18 1359 97 %     Weight 01/02/18 1400 197 lb (89.4 kg)     Height 01/02/18 1400 5\' 6"  (1.676 m)     Head Circumference --      Peak Flow --       Pain Score 01/02/18 1359 6     Pain Loc --      Pain Edu? --      Excl. in [place]? --    Constitutional: Alert and oriented. Well appearing and in no acute distress. Eyes: Conjunctivae are normal. Head: Atraumatic. Nose: Mild congestion/no rhinnorhea. Mouth/Throat: Mucous membranes are moist.  Oropharynx non-erythematous. Neck: No stridor.   Hematological/Lymphatic/Immunilogical: No cervical lymphadenopathy. Cardiovascular: Normal rate, regular rhythm. Grossly normal heart sounds.  Good peripheral circulation. Respiratory: Normal respiratory effort.  No retractions. Lungs.  No wheezes or rhonchi were noted however patient does have a congested cough.  She does not appear to be any shortness of breath and is able to talk in complete sentences without any difficulty. Gastrointestinal: Soft and nontender. No distention.  No CVA tenderness. Musculoskeletal: Moves upper and lower extremities without any difficulty. Neurologic:  Normal speech and language. No gross focal neurologic deficits are appreciated.  Skin:  Skin is warm, dry and intact. No rash noted. Psychiatric: Mood and affect are normal. Speech and behavior are normal.  ____________________________________________   LABS (all labs ordered are listed, but only abnormal results are displayed)  Labs Reviewed - No data to display   RADIOLOGY  ED MD interpretation:  Chest x-ray no infiltrate noted.  Increased bronchial markings.  Official radiology report(s): Dg Chest 2 View  Result Date: 01/02/2018 CLINICAL DATA:  Productive cough, congestion, and sore throat for the past 2 weeks. EXAM: CHEST - 2 VIEW COMPARISON:  Chest x-ray report dated April 14, 2003. FINDINGS: The heart size and mediastinal contours are within normal limits. Normal pulmonary vascularity. Streaky, linear opacities in the left upper lobe and lingula, likely atelectasis. No focal consolidation, pleural effusion, or pneumothorax. No acute osseous abnormality.  Exaggerated thoracic kyphosis. IMPRESSION: No active cardiopulmonary disease. Electronically Signed   By: Titus Dubin M.D.   On: 01/02/2018 14:27    ____________________________________________   PROCEDURES  Procedure(s) performed: None  Procedures  Critical Care performed: No  ____________________________________________   INITIAL IMPRESSION / ASSESSMENT AND PLAN / ED COURSE Patient was given a DuoNeb treatment while in the department with minimal improvement.  Patient's O2 sat was 97 in triage.  Patient was observed with a slightly productive cough during exam.  There was no respiratory difficulty.  Patient was discharged with prednisone, Tessalon Perles, and Bactrim 400 mg 2 tablets twice daily as patient has difficulty swallowing large pills.  She is to follow-up with her PCP or return to [facility] acute care if any continued problems.  He is to return to the emergency department if any severe worsening of her symptoms.  ____________________________________________   FINAL CLINICAL IMPRESSION(S) / ED DIAGNOSES  Final diagnoses:  Acute bronchitis, unspecified organism     ED Discharge Orders        Ordered  predniSONE (DELTASONE) 10 MG tablet     01/02/18 1519    benzonatate (TESSALON PERLES) 100 MG capsule     01/02/18 1519    sulfamethoxazole-trimethoprim (BACTRIM DS,SEPTRA DS) 800-160 MG tablet  2 times daily     01/02/18 1521    sulfamethoxazole-trimethoprim (BACTRIM,SEPTRA) 400-80 MG tablet  2 times daily     01/02/18 1528       Note:  This document was prepared using Dragon voice recognition software and may include unintentional dictation errors.    Johnn Hai, PA-C 01/02/18 1557    Lavonia Drafts, MD 01/03/18 1318

## 2018-01-02 NOTE — ED Notes (Signed)
First Nurse Note: Pt brought over by [facility] for lightheadedness, SOB for 2 weeks, they ambulated her and her O2 went to 90%.

## 2018-01-02 NOTE — ED Triage Notes (Signed)
Pt to ED from home c/o productive cough clear/yellow sputum, nasal congestion, sore throat x2 weeks but getting worse.  Chest rise even and unlabored, skin warm and dry, A&Ox4, speaking in complete sentences.

## 2018-01-02 NOTE — Discharge Instructions (Addendum)
Follow-up with your primary care provider for [facility] care if any continued problems.  Begin taking antibiotic Bactrim DS twice daily for 10 days, redness and 3 tablets once a day for the next 3 days for inflammation and Tessalon Perles 1 or 2 every 8 hours as needed for cough.  The cough medication will not cause drowsiness or increase her risk for falling.  Prednisone could increase your appetite or interfere with sleeping.  This is temporary.

## 2018-01-02 NOTE — ED Notes (Signed)
See triage note  Presents with prod cough and congestion for about 1 week  Also has had some sinus pressure  States cough in the mornings is yellowish  Then becomes clear .  Low grade fever on arrival

## 2018-02-20 ENCOUNTER — Ambulatory Visit
Admission: RE | Admit: 2018-02-20 | Discharge: 2018-02-20 | Disposition: A | Discharge status: Home / Self Care | Payer: Medicare Other | Source: Ambulatory Visit | Attending: Internal Medicine | Admitting: Internal Medicine

## 2018-02-20 DIAGNOSIS — Z1231 Encounter for screening mammogram for malignant neoplasm of breast: Secondary | ICD-10-CM

## 2018-02-21 ENCOUNTER — Other Ambulatory Visit: Payer: Self-pay | Admitting: Internal Medicine

## 2018-02-21 DIAGNOSIS — N6310 Unspecified lump in the right breast, unspecified quadrant: Secondary | ICD-10-CM

## 2018-02-21 DIAGNOSIS — R928 Other abnormal and inconclusive findings on diagnostic imaging of breast: Secondary | ICD-10-CM

## 2018-03-12 ENCOUNTER — Ambulatory Visit
Admission: RE | Admit: 2018-03-12 | Discharge: 2018-03-12 | Disposition: A | Discharge status: Home / Self Care | Payer: Medicare Other | Source: Ambulatory Visit | Attending: Internal Medicine | Admitting: Internal Medicine

## 2018-03-12 DIAGNOSIS — R928 Other abnormal and inconclusive findings on diagnostic imaging of breast: Secondary | ICD-10-CM | POA: Diagnosis present

## 2018-03-12 DIAGNOSIS — N6310 Unspecified lump in the right breast, unspecified quadrant: Secondary | ICD-10-CM | POA: Insufficient documentation

## 2018-03-13 ENCOUNTER — Other Ambulatory Visit: Payer: Self-pay | Admitting: Internal Medicine

## 2018-03-13 DIAGNOSIS — N6310 Unspecified lump in the right breast, unspecified quadrant: Secondary | ICD-10-CM

## 2018-03-13 DIAGNOSIS — R928 Other abnormal and inconclusive findings on diagnostic imaging of breast: Secondary | ICD-10-CM

## 2018-03-20 ENCOUNTER — Ambulatory Visit
Admission: RE | Admit: 2018-03-20 | Discharge: 2018-03-20 | Disposition: A | Discharge status: Home / Self Care | Payer: Medicare Other | Source: Ambulatory Visit | Attending: Internal Medicine | Admitting: Internal Medicine

## 2018-03-20 DIAGNOSIS — R928 Other abnormal and inconclusive findings on diagnostic imaging of breast: Secondary | ICD-10-CM

## 2018-03-20 DIAGNOSIS — N6310 Unspecified lump in the right breast, unspecified quadrant: Secondary | ICD-10-CM | POA: Insufficient documentation

## 2018-03-20 HISTORY — PX: BREAST BIOPSY: SHX20

## 2018-06-05 ENCOUNTER — Other Ambulatory Visit: Payer: Self-pay | Admitting: Internal Medicine

## 2018-06-05 DIAGNOSIS — N6310 Unspecified lump in the right breast, unspecified quadrant: Secondary | ICD-10-CM

## 2018-07-20 ENCOUNTER — Emergency Department: Payer: Medicare Other

## 2018-07-20 ENCOUNTER — Other Ambulatory Visit: Payer: Self-pay

## 2018-07-20 ENCOUNTER — Encounter: Payer: Self-pay | Admitting: Intensive Care

## 2018-07-20 ENCOUNTER — Emergency Department
Admission: EM | Admit: 2018-07-20 | Discharge: 2018-07-20 | Disposition: A | Discharge status: Home / Self Care | Payer: Medicare Other | Attending: Emergency Medicine | Admitting: Emergency Medicine

## 2018-07-20 DIAGNOSIS — I10 Essential (primary) hypertension: Secondary | ICD-10-CM | POA: Diagnosis not present

## 2018-07-20 DIAGNOSIS — Z79899 Other long term (current) drug therapy: Secondary | ICD-10-CM | POA: Diagnosis not present

## 2018-07-20 DIAGNOSIS — Z85828 Personal history of other malignant neoplasm of skin: Secondary | ICD-10-CM | POA: Insufficient documentation

## 2018-07-20 DIAGNOSIS — R51 Headache: Secondary | ICD-10-CM | POA: Diagnosis not present

## 2018-07-20 DIAGNOSIS — R42 Dizziness and giddiness: Secondary | ICD-10-CM | POA: Insufficient documentation

## 2018-07-20 DIAGNOSIS — N390 Urinary tract infection, site not specified: Secondary | ICD-10-CM | POA: Diagnosis not present

## 2018-07-20 LAB — URINALYSIS, COMPLETE (UACMP) WITH MICROSCOPIC
BILIRUBIN URINE: NEGATIVE
Glucose, UA: NEGATIVE mg/dL
HGB URINE DIPSTICK: NEGATIVE
KETONES UR: 5 mg/dL — AB
Nitrite: NEGATIVE
PH: 5 (ref 5.0–8.0)
Protein, ur: NEGATIVE mg/dL
Specific Gravity, Urine: 1.024 (ref 1.005–1.030)

## 2018-07-20 LAB — CBC
HCT: 46.5 % — ABNORMAL HIGH (ref 36.0–46.0)
Hemoglobin: 15.3 g/dL — ABNORMAL HIGH (ref 12.0–15.0)
MCH: 28.4 pg (ref 26.0–34.0)
MCHC: 32.9 g/dL (ref 30.0–36.0)
MCV: 86.4 fL (ref 80.0–100.0)
Platelets: 242 10*3/uL (ref 150–400)
RBC: 5.38 MIL/uL — AB (ref 3.87–5.11)
RDW: 13.3 % (ref 11.5–15.5)
WBC: 7.5 10*3/uL (ref 4.0–10.5)
nRBC: 0 % (ref 0.0–0.2)

## 2018-07-20 LAB — BASIC METABOLIC PANEL
Anion gap: 8 (ref 5–15)
BUN: 15 mg/dL (ref 8–23)
CALCIUM: 9.6 mg/dL (ref 8.9–10.3)
CO2: 28 mmol/L (ref 22–32)
CREATININE: 1.02 mg/dL — AB (ref 0.44–1.00)
Chloride: 103 mmol/L (ref 98–111)
GFR calc Af Amer: >60 mL/min (ref >60)
GFR calc non Af Amer: 53 mL/min — ABNORMAL LOW (ref >60)
GLUCOSE: 209 mg/dL — AB (ref 70–99)
Potassium: 3.7 mmol/L (ref 3.5–5.1)
Sodium: 139 mmol/L (ref 135–145)

## 2018-07-20 LAB — TROPONIN I: Troponin I: <0.03 ng/mL (ref <0.03)

## 2018-07-20 MED ORDER — FOSFOMYCIN TROMETHAMINE 3 G PO PACK
3.0000 g | PACK | Freq: Once | ORAL | Status: AC
  Administered 2018-07-20: 3 g via ORAL
  Filled 2018-07-20: qty 3

## 2018-07-20 MED ORDER — MECLIZINE HCL 25 MG PO TABS
25.0000 mg | ORAL_TABLET | Freq: Once | ORAL | Status: AC
  Administered 2018-07-20: 25 mg via ORAL
  Filled 2018-07-20: qty 1

## 2018-07-20 MED ORDER — FOSFOMYCIN TROMETHAMINE 3 G PO PACK: 3.0000 g | PACK | Freq: Once | ORAL | 0 refills | Status: DC

## 2018-07-20 MED ORDER — SODIUM CHLORIDE 0.9 % IV BOLUS
500.0000 mL | Freq: Once | INTRAVENOUS | Status: AC
  Administered 2018-07-20: 500 mL via INTRAVENOUS

## 2018-07-20 MED ORDER — MECLIZINE HCL 25 MG PO TABS: 25.0000 mg | ORAL_TABLET | Freq: Three times a day (TID) | ORAL | 0 refills | Status: DC | PRN

## 2018-07-20 NOTE — ED Triage Notes (Addendum)
Patient c/o dizziness, lightheadedness and off balance X 1 week that worsened today. C/o dizziness while sitting in wheelchair. Speech clear. grips strong and equal. No weakness. R ear pain X 1 week. No drainage. Patient c/o pain in R shoulder down to elbow

## 2018-07-20 NOTE — ED Provider Notes (Addendum)
[facility] Emergency Department Provider Note  ____________________________________________   I have reviewed the triage vital signs and the nursing notes. Where available I have reviewed prior notes and, if possible and indicated, outside hospital notes.    HISTORY  Chief Complaint Dizziness    HPI Tabitha Gill is a 72 y.o. female who presents today complaining of feeling dizzy.  She states that the last week or so she has had some balance issues and she states that she is been having a sensation of motion which has been going on on interruptedly for the entire week.  Waxes and wanes but never completely goes away.  No focal numbness or weakness no head trauma no difficulty speaking or change in vision.  She states that last night, she thought maybe she had a "pimple" in her right ear but she states that now her ear does not hurt her.  There was never any drainage or trauma.  She states her hearing is okay.  She states that it has a sensation of motion, it looks like I might be moving while she sits still in the bed.  It is exacerbated by moving herself.  She has had no significant headaches although she have a slight headache she states which is been present this week.  Gradual onset not worst headache of life.     Past Medical History:  Diagnosis Date  . Cancer ([place]) 2005   skin   . Hypertension   . Panic attack     There are no active problems to display for this patient.   Past Surgical History:  Procedure Laterality Date  . ABDOMINAL HYSTERECTOMY    . ANKLE SURGERY    . BREAST BIOPSY Right 03/20/2018   Affirm Bx- Ribbon Clip- path pending  . EYE SURGERY    . TONSILLECTOMY      Prior to Admission medications   Medication Sig Start Date End Date Taking? Authorizing Provider  amLODipine (NORVASC) 5 MG tablet Take 5 mg by mouth daily. 11/23/16 11/23/17  [provider]  benzonatate (TESSALON PERLES) 100 MG capsule 1 or 2 every 8 hours as  needed for cough. 01/02/18   Johnn Hai, PA-C  bisacodyl (DULCOLAX) 10 MG suppository Place 1 suppository (10 mg total) rectally as needed for moderate constipation. 05/31/17   Carrie Mew, MD  carboxymethylcellulose 1 % ophthalmic solution Apply 1 drop to eye daily as needed.    [provider]  clonazePAM (KLONOPIN) 0.5 MG tablet Take 0.25 mg by mouth 2 (two) times daily.    [provider]  FLUoxetine (PROZAC) 20 MG capsule Take 20 mg by mouth daily.    [provider]  lamoTRIgine (LAMICTAL) 100 MG tablet Take 100-150 mg by mouth 2 (two) times daily.     [provider]  mirtazapine (REMERON SOL-TAB) 30 MG disintegrating tablet Take 30 mg by mouth at bedtime. 05/02/16   [provider]  predniSONE (DELTASONE) 10 MG tablet Take 3 tablets once a day for 3 days 01/02/18   Johnn Hai, PA-C  senna-docusate (SENOKOT-S) 8.6-50 MG tablet Take 2 tablets by mouth 2 (two) times daily. 05/31/17   Carrie Mew, MD  sulfamethoxazole-trimethoprim (BACTRIM DS,SEPTRA DS) 800-160 MG tablet Take 1 tablet by mouth 2 (two) times daily. 01/02/18   Johnn Hai, PA-C  sulfamethoxazole-trimethoprim (BACTRIM,SEPTRA) 400-80 MG tablet Take 2 tablets by mouth 2 (two) times daily. 01/02/18   Johnn Hai, PA-C    Allergies Cymbalta [duloxetine  hcl]; Penicillins; Sumycin [tetracycline]; and Valium [diazepam]  Family History  Problem Relation Age of Onset  . Breast cancer Maternal Aunt 68    Social History Social History   Tobacco Use  . Smoking status: Never Smoker  . Smokeless tobacco: Never Used  Substance Use Topics  . Alcohol use: No  . Drug use: No    Review of Systems Constitutional: No fever/chills Eyes: No visual changes. ENT: No sore throat. No stiff neck no neck pain Cardiovascular: Denies chest pain. Respiratory: Denies shortness of breath. Gastrointestinal:   no vomiting.  No diarrhea.  No constipation. Genitourinary:  Negative for dysuria. Musculoskeletal: Negative lower extremity swelling Skin: Negative for rash. Neurological: Negative for severe headaches, focal weakness or numbness.   ____________________________________________   PHYSICAL EXAM:  VITAL SIGNS: ED Triage Vitals [07/20/18 1049]  Enc Vitals Group     BP (!) 144/85     Pulse Rate (!) 108     Resp 16     Temp 98.2 F (36.8 C)     Temp Source Oral     SpO2 94 %     Weight 193 lb (87.5 kg)     Height 5' 6.5" (1.689 m)     Head Circumference      Peak Flow      Pain Score 8     Pain Loc      Pain Edu?      Excl. in [place]?     Constitutional: Alert and oriented. Well appearing and in no acute distress. Eyes: Conjunctivae are normal Head: Atraumatic HEENT: No congestion/rhinnorhea. Mucous membranes are moist.  Oropharynx non-erythematous TMs are normal there is no evidence of abscess inflammation erythema Neck:   Nontender with no meningismus, no masses, no stridor Cardiovascular: Normal rate, regular rhythm. Grossly normal heart sounds.  Good peripheral circulation. Respiratory: Normal respiratory effort.  No retractions. Lungs CTAB. Abdominal: Soft and nontender. No distention. No guarding no rebound Back:  There is no focal tenderness or step off.  there is no midline tenderness there are no lesions noted. there is no CVA tenderness Musculoskeletal: No lower extremity tenderness, no upper extremity tenderness. No joint effusions, no DVT signs strong distal pulses no edema Neurologic: Cranial nerves II through XII are grossly intact 5 out of 5 strength bilateral upper and lower extremity. Finger to nose within normal limits heel to shin within normal limits, speech is normal with no word finding difficulty or dysarthria, reflexes symmetric, pupils are equally round and reactive to light, there is no pronator drift, sensation is normal, vision is intact to confrontation, gait is deferred, there is no nystagmus, normal neurologic  exam Skin:  Skin is warm, dry and intact. No rash noted. Psychiatric: Mood and affect are normal. Speech and behavior are normal.  ____________________________________________   LABS (all labs ordered are listed, but only abnormal results are displayed)  Labs Reviewed  BASIC METABOLIC PANEL - Abnormal; Notable for the following components:      Result Value   Glucose, Bld 209 (*)    Creatinine, Ser 1.02 (*)    GFR calc non Af Amer 53 (*)    All other components within normal limits  CBC - Abnormal; Notable for the following components:   RBC 5.38 (*)    Hemoglobin 15.3 (*)    HCT 46.5 (*)    All other components within normal limits  URINALYSIS, COMPLETE (UACMP) WITH MICROSCOPIC - Abnormal; Notable for the following components:   Color, Urine AMBER (*)  APPearance HAZY (*)    Ketones, ur 5 (*)    Leukocytes, UA LARGE (*)    Bacteria, UA RARE (*)    All other components within normal limits  TROPONIN I    Pertinent labs  results that were available during my care of the patient were reviewed by me and considered in my medical decision making (see chart for details). ____________________________________________  EKG  I personally interpreted any EKGs ordered by me or triage Sinus rhythm rate 106 mild tachycardia noted, LAD noted, no acute ST elevation or depression ossific ST changes ____________________________________________  RADIOLOGY  Pertinent labs & imaging results that were available during my care of the patient were reviewed by me and considered in my medical decision making (see chart for details). If possible, patient and/or family made aware of any abnormal findings.  Ct Head Wo Contrast  Result Date: 07/20/2018 CLINICAL DATA:  Dizziness and lightheadedness EXAM: CT HEAD WITHOUT CONTRAST TECHNIQUE: Contiguous axial images were obtained from the base of the skull through the vertex without intravenous contrast. COMPARISON:  08/19/2005 FINDINGS: Brain: Mild  atrophic changes are noted commenced with the patient's given age. No findings to suggest acute hemorrhage, acute infarction or space-occupying mass lesion are noted. Vascular: No hyperdense vessel or unexpected calcification. Skull: Normal. Negative for fracture or focal lesion. Sinuses/Orbits: No acute finding. Other: None IMPRESSION: Mild atrophic changes are noted without acute intracranial abnormality. Electronically Signed   By: Inez Catalina M.D.   On: 07/20/2018 11:19   ____________________________________________    PROCEDURES  Procedure(s) performed: None  Procedures  Critical Care performed: None  ____________________________________________   INITIAL IMPRESSION / ASSESSMENT AND PLAN / ED COURSE  Pertinent labs & imaging results that were available during my care of the patient were reviewed by me and considered in my medical decision making (see chart for details).  Here with a sensation of true motion for the last week, patient feels a spinning motion and has a sensation that your others are moving.  Cranial nerves are intact, neurologic exam has an NIH stroke scale of 0 including cerebellar function, no ocular pathology Observed acutely.  Patient in no acute distress.  However, she does have a sensation of motion which I think is most consistent with vertigo.  Given her age I did do a CT scan which is negative, we will therefore do an MRI.  Patient does have evidence of a small MRI of the there are many squamous cells seen which could contaminate the sample.  We will send a urine culture.  Low suspicion for ACS PE or dissection with this pathology.  Opponent is negative despite a week of symptoms she has no chest pain or shortness of breath.  We will see what MRI shows.  ----------------------------------------- 2:12 PM on 07/20/2018 -----------------------------------------  Patient has a nearly complete resolution of her symptoms after Antivert MRI is negative neurologic  exam is still reassuring with NIH stroke scale of 0, she does have what appears to be urinary tract infection, we will treat her, urine cultures pending, and it as she is so amenable to Antivert we will send her home with Antivert.  Patient's age is taken to account only prescribed this medication we have advised her to be careful of somnolence and other side effects.  Patient and family are very comfortable this plan they are watching TV and in no acute distress   ____________________________________________   FINAL CLINICAL IMPRESSION(S) / ED DIAGNOSES  Final diagnoses:  None  This chart was dictated using voice recognition software.  Despite best efforts to proofread,  errors can occur which can change meaning.     Schuyler Amor, MD 07/20/18 1251    Schuyler Amor, MD 07/20/18 1414

## 2018-07-20 NOTE — Discharge Instructions (Addendum)
We will give you 1 dose of antibiotics here, you do not need to fill the prescription that was accidentally sent to your pharmacy, we prefer to give the medication here.  If you have any new or worrisome symptoms including numbness, weakness, severe headache, persistent vomiting, any recurrent vertigo symptoms or you feel worse in any way please return to the emergency department.  Otherwise, please follow closely with your primary care doctor on Monday as well as the referral doctor of neurology.  We will send her home with a prescription for Antivert.  This may make you tired, please take it as needed for your vertigo symptoms.

## 2018-07-20 NOTE — ED Notes (Signed)
FIRST NURSE NOTE:  Pt arrived via wheelchair from [facility], pt c/o dizziness, headache, right arm pain and is unsteady on her feet, sxs started about 1 week ago but are worse now.  Speech clear, no distress noted.

## 2018-07-21 LAB — URINE CULTURE: CULTURE: NO GROWTH

## 2018-12-10 ENCOUNTER — Other Ambulatory Visit: Payer: Self-pay | Admitting: Internal Medicine

## 2018-12-10 DIAGNOSIS — N6310 Unspecified lump in the right breast, unspecified quadrant: Secondary | ICD-10-CM

## 2018-12-18 ENCOUNTER — Ambulatory Visit
Admission: RE | Admit: 2018-12-18 | Discharge: 2018-12-18 | Disposition: A | Discharge status: Home / Self Care | Payer: Medicare Other | Source: Ambulatory Visit | Attending: Internal Medicine | Admitting: Internal Medicine

## 2018-12-18 ENCOUNTER — Other Ambulatory Visit: Payer: Self-pay

## 2018-12-18 DIAGNOSIS — N6001 Solitary cyst of right breast: Secondary | ICD-10-CM | POA: Insufficient documentation

## 2018-12-18 DIAGNOSIS — N6310 Unspecified lump in the right breast, unspecified quadrant: Secondary | ICD-10-CM

## 2020-02-04 ENCOUNTER — Other Ambulatory Visit: Payer: Self-pay | Admitting: Internal Medicine

## 2020-02-04 DIAGNOSIS — N6310 Unspecified lump in the right breast, unspecified quadrant: Secondary | ICD-10-CM

## 2020-05-04 ENCOUNTER — Ambulatory Visit: Payer: Self-pay | Attending: Internal Medicine

## 2020-05-04 DIAGNOSIS — Z23 Encounter for immunization: Secondary | ICD-10-CM

## 2020-05-04 NOTE — Progress Notes (Signed)
   Covid-19 Vaccination Clinic  Name:  Meya Clutter    MRN: [id number] DOB: 18-May-1946  05/04/2020  Ms. Rigel was observed post Covid-19 immunization for 15 minutes without incident. She was provided with Vaccine Information Sheet and instruction to access the V-Safe system.   Ms. Bilotta was instructed to call [phone] with any severe reactions post vaccine: Marland Kitchen Difficulty breathing  . Swelling of face and throat  . A fast heartbeat  . A bad rash all over body  . Dizziness and weakness

## 2020-12-02 IMAGING — MG DIGITAL DIAGNOSTIC BILATERAL MAMMOGRAM WITH TOMO AND CAD
6 of 10 series · 6 of 30 positions shown · non-contrast
Comparison: Previous exam(s).

CLINICAL DATA: Follow-up probably benign right breast asymmetry.

EXAM:
DIGITAL DIAGNOSTIC BILATERAL MAMMOGRAM WITH CAD AND TOMO

[L CC synth-2D]
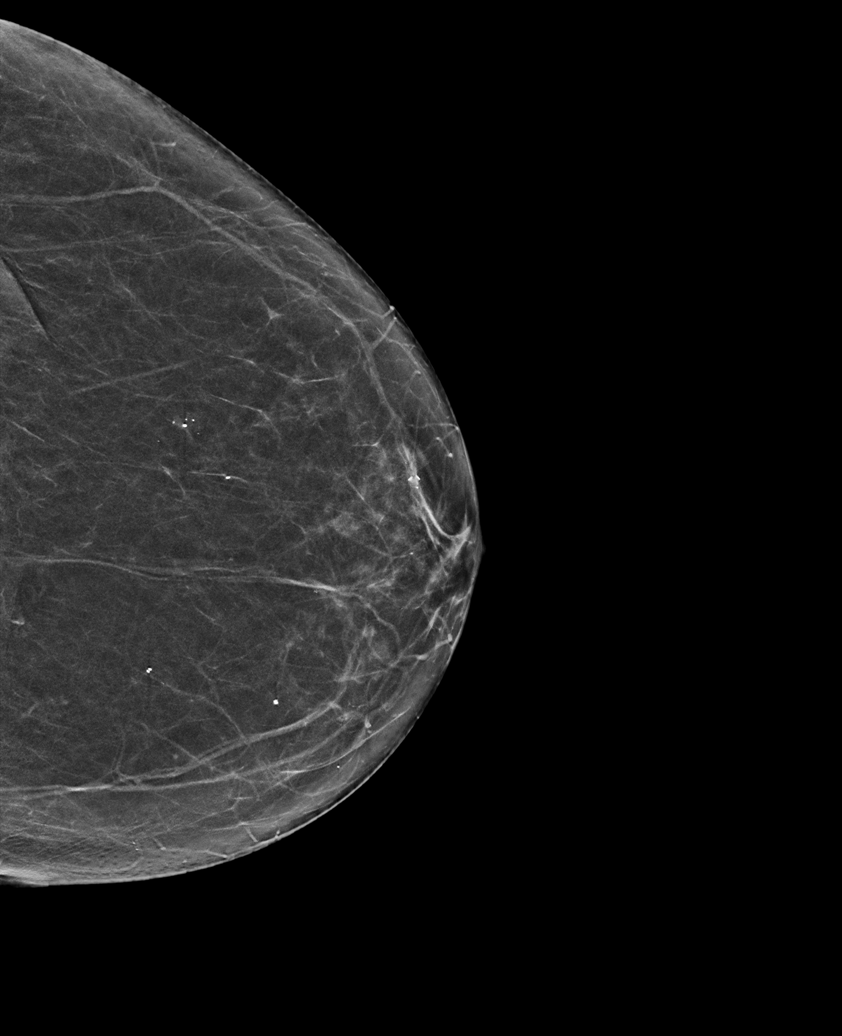

[R CC synth-2D (1 of 2)]
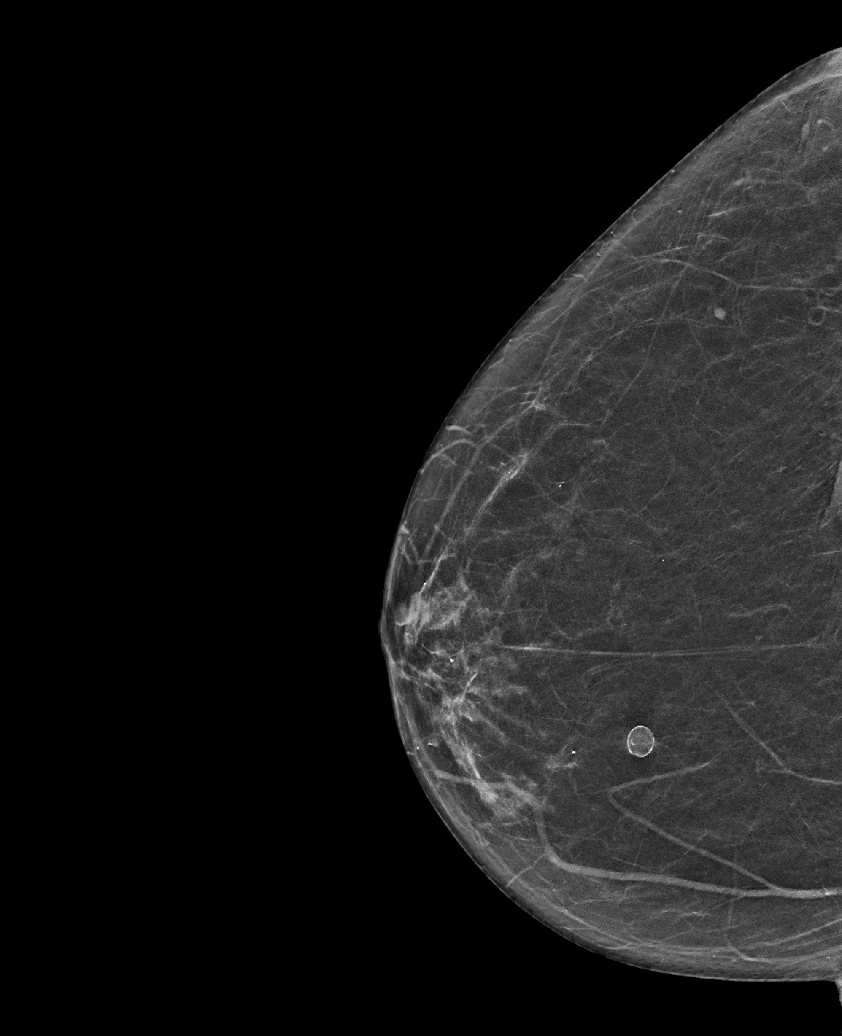

[R CC synth-2D (2 of 2)]
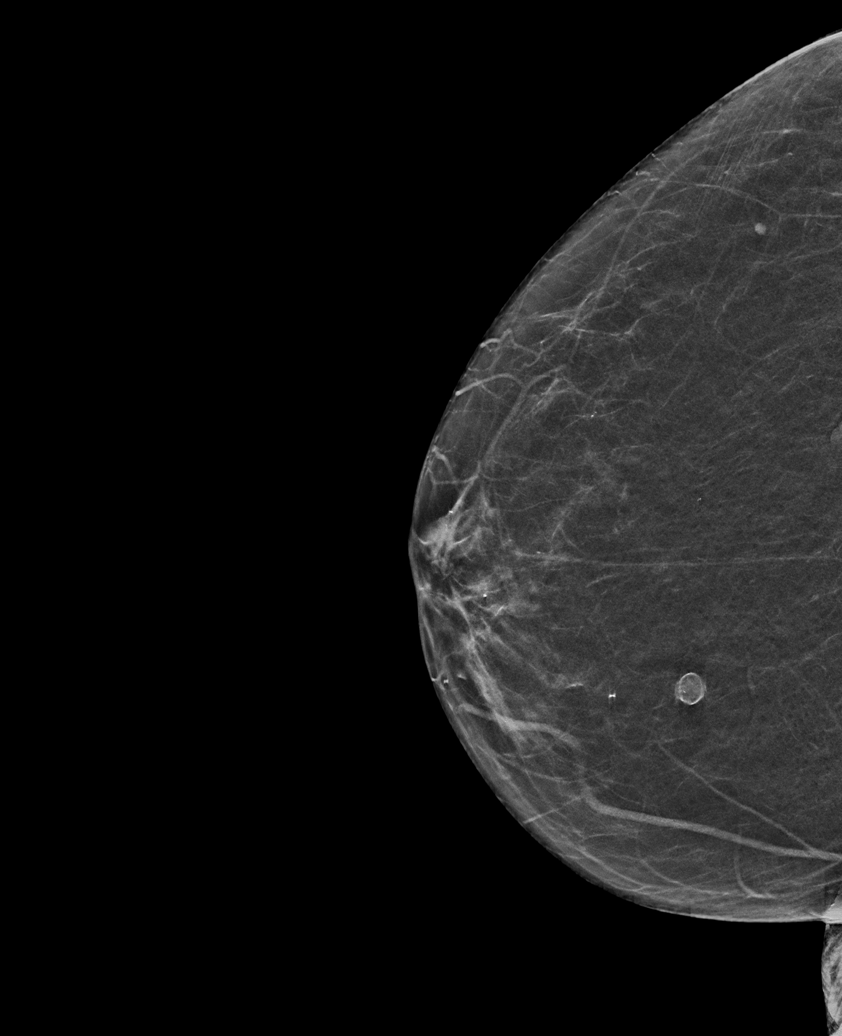

[R MLO synth-2D]
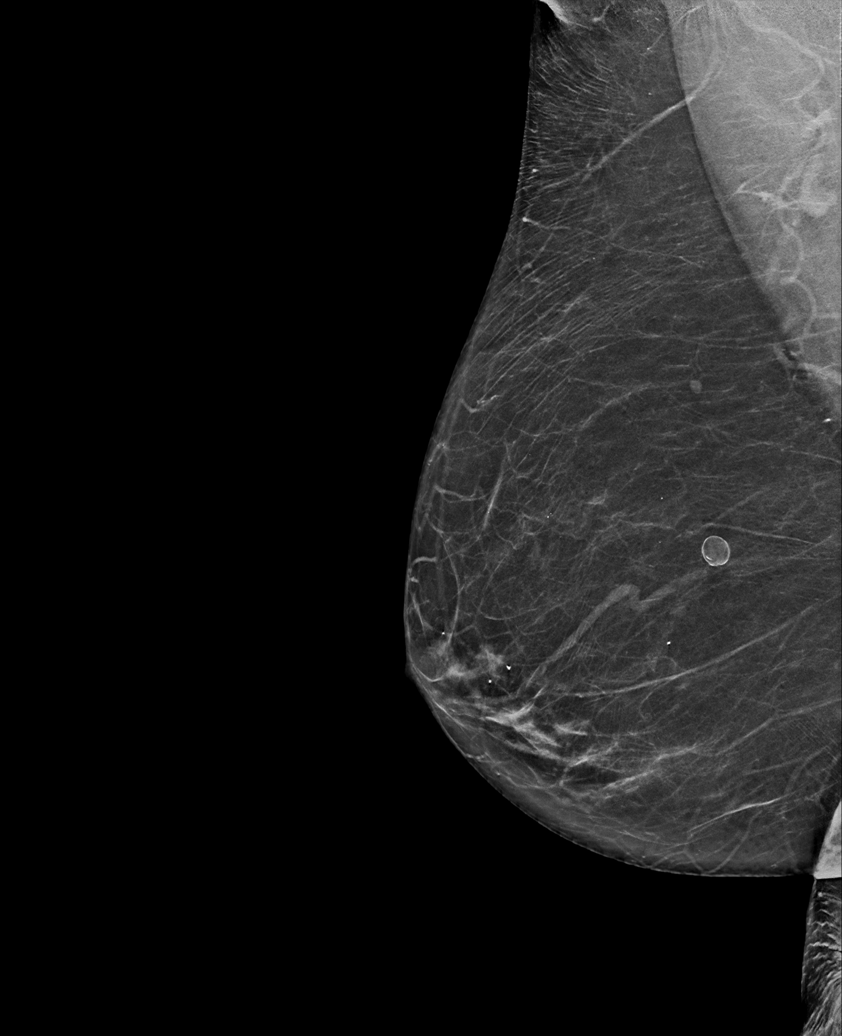

[L MLO synth-2D]
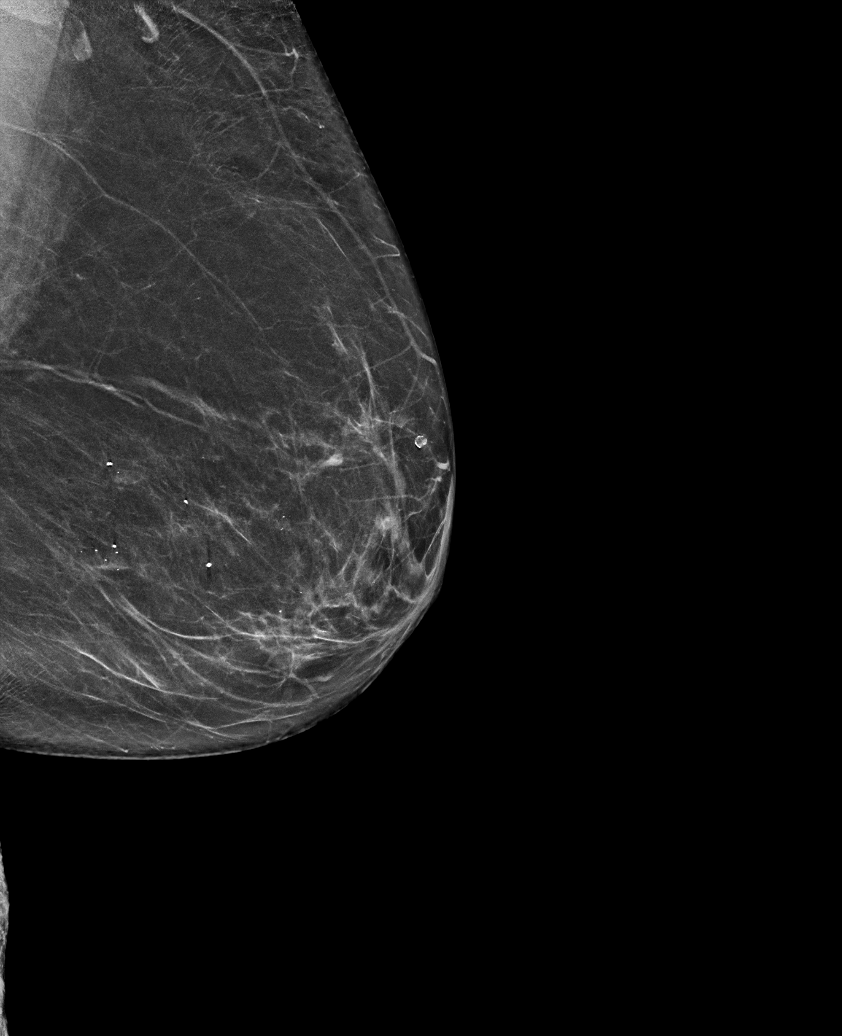

[L CC tomo · tomo slice 33/65.0]
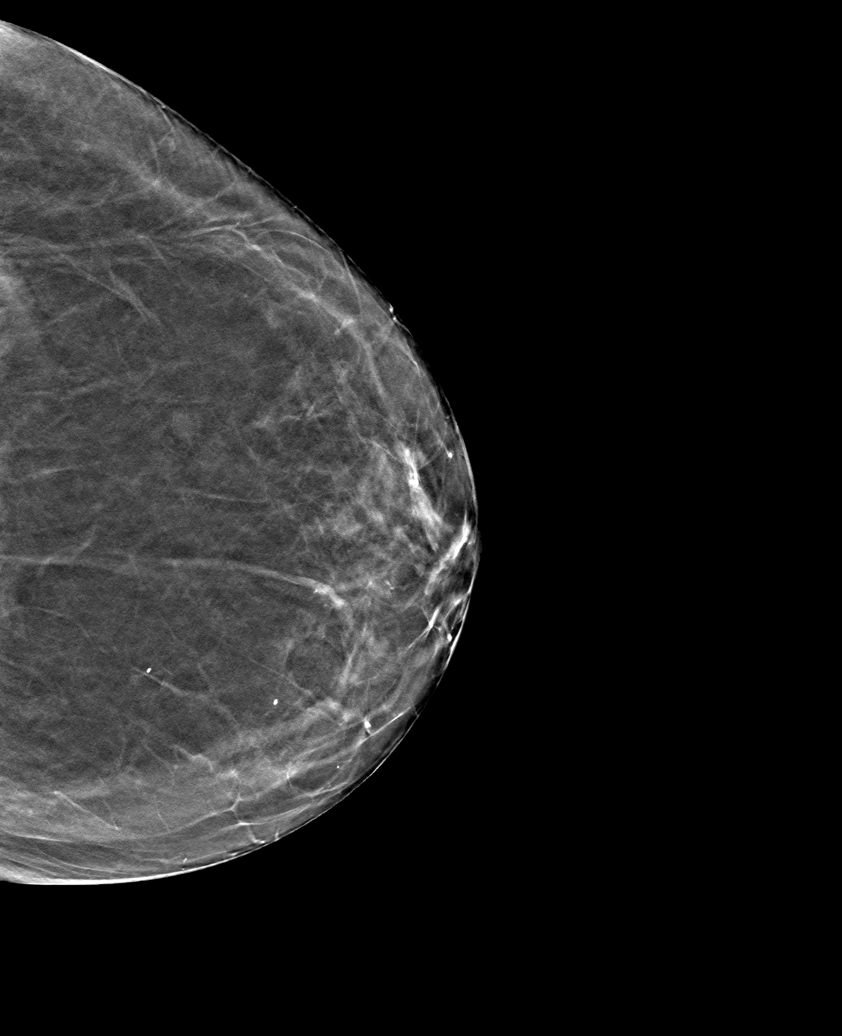

[6 of 30 positions shown; findings below may reference images not displayed]

ACR Breast Density Category b: There are scattered areas of
fibroglandular density.
FINDINGS: The right breast asymmetry has resolved. An oil cyst is now seen in
the right breast. No suspicious masses, calcifications, or
distortion in either breast.

Mammographic images were processed with CAD.
IMPRESSION: No mammographic evidence of malignancy. Resolution of probably
benign focal asymmetry.

RECOMMENDATION:
Annual screening mammography in December 2019

I have discussed the findings and recommendations with the patient.
Results were also provided in writing at the conclusion of the
visit. If applicable, a reminder letter will be sent to the patient
regarding the next appointment.

BI-RADS CATEGORY  2: Benign.

## 2021-04-16 DIAGNOSIS — M1712 Unilateral primary osteoarthritis, left knee: Secondary | ICD-10-CM | POA: Insufficient documentation

## 2021-09-04 NOTE — Discharge Instructions (Signed)
Instructions after Total Knee Replacement   Oscar Forman P. Amari Zagal, Jr., M.D.     Dept. of Orthopaedics & Sports Medicine  [facility]  [address]  [place], [place]  [address]  Phone: [phone]   Fax: [phone]    DIET: Drink plenty of non-alcoholic fluids. Resume your normal diet. Include foods high in fiber.  ACTIVITY:  You may use crutches or a walker with weight-bearing as tolerated, unless instructed otherwise. You may be weaned off of the walker or crutches by your Physical Therapist.  Do NOT place pillows under the knee. Anything placed under the knee could limit your ability to straighten the knee.   Continue doing gentle exercises. Exercising will reduce the pain and swelling, increase motion, and prevent muscle weakness.   Please continue to use the TED compression stockings for 6 weeks. You may remove the stockings at night, but should reapply them in the morning. Do not drive or operate any equipment until instructed.  WOUND CARE:  Continue to use the PolarCare or ice packs periodically to reduce pain and swelling. You may bathe or shower after the staples are removed at the first office visit following surgery.  MEDICATIONS: You may resume your regular medications. Please take the pain medication as prescribed on the medication. Do not take pain medication on an empty stomach. You have been given a prescription for a blood thinner (Lovenox or Coumadin). Please take the medication as instructed. (NOTE: After completing a 2 week course of Lovenox, take one Enteric-coated aspirin once a day. This along with elevation will help reduce the possibility of phlebitis in your operated leg.) Do not drive or drink alcoholic beverages when taking pain medications.  CALL THE OFFICE FOR: Temperature above 101 degrees Excessive bleeding or drainage on the dressing. Excessive swelling, coldness, or paleness of the toes. Persistent nausea and vomiting.  FOLLOW-UP:  You  should have an appointment to return to the office in 10-14 days after surgery. Arrangements have been made for continuation of Physical Therapy (either home therapy or outpatient therapy).   [facility] Department Directory         [email or web address]       [email or web address]          Cardiology  Appointments: [place] - [phone] [place] - [phone]  Endocrinology  Appointments: [place] - [phone] [place] - [phone]  Gastroenterology  Appointments: [place] - [phone] [place] - [phone]        General Surgery   Appointments: [place] - [phone]  Internal Medicine/Family Medicine  Appointments: [place] - [phone] [place] - [phone] [place] - [phone]  Metabolic and Weigh Loss Surgery  Appointments: [place] - [phone]        Neurology  Appointments: [place] - [phone] [place] - [phone]  Neurosurgery  Appointments: [place] - [phone]  Obstetrics & Gynecology  Appointments: [place] - [phone] [place] - [phone]        Pediatrics  Appointments: [place] - [phone] [place] - [phone]  Physiatry  Appointments: [place] [phone]  Physical Therapy  Appointments: [place] - [phone] [place] - [phone]        Podiatry  Appointments: [place] - [phone] [place] - [phone]  Pulmonology  Appointments: [place] - [phone]  Rheumatology  Appointments: [place] - [phone]        [place] Location: [facility]  [address] [place], [place]  [address]  [place] Location: [facility] [address] [place], [place]  [address]  [place] Location: [facility] [address] [place], [place]  [address]    

## 2021-09-07 ENCOUNTER — Other Ambulatory Visit
Admission: RE | Admit: 2021-09-07 | Discharge: 2021-09-07 | Disposition: A | Discharge status: Home / Self Care | Payer: Medicare Other | Source: Ambulatory Visit | Attending: Orthopedic Surgery | Admitting: Orthopedic Surgery

## 2021-09-07 ENCOUNTER — Other Ambulatory Visit: Payer: Self-pay

## 2021-09-07 VITALS — BP 144/85 | HR 76 | Temp 97.9°F | Resp 14 | Ht 66.0 in | Wt 186.0 lb

## 2021-09-07 DIAGNOSIS — Z01812 Encounter for preprocedural laboratory examination: Secondary | ICD-10-CM

## 2021-09-07 DIAGNOSIS — Z01818 Encounter for other preprocedural examination: Secondary | ICD-10-CM | POA: Diagnosis not present

## 2021-09-07 DIAGNOSIS — Z880 Allergy status to penicillin: Secondary | ICD-10-CM | POA: Insufficient documentation

## 2021-09-07 DIAGNOSIS — M1712 Unilateral primary osteoarthritis, left knee: Secondary | ICD-10-CM | POA: Insufficient documentation

## 2021-09-07 HISTORY — DX: Unilateral primary osteoarthritis, left knee: M17.12

## 2021-09-07 HISTORY — DX: Vitamin D deficiency, unspecified: E55.9

## 2021-09-07 LAB — COMPREHENSIVE METABOLIC PANEL
ALT: 13 U/L (ref 0–44)
AST: 22 U/L (ref 15–41)
Albumin: 4 g/dL (ref 3.5–5.0)
Alkaline Phosphatase: 68 U/L (ref 38–126)
Anion gap: 7 (ref 5–15)
BUN: 13 mg/dL (ref 8–23)
CO2: 27 mmol/L (ref 22–32)
Calcium: 9.8 mg/dL (ref 8.9–10.3)
Chloride: 101 mmol/L (ref 98–111)
Creatinine, Ser: 0.9 mg/dL (ref 0.44–1.00)
GFR, Estimated: >60 mL/min (ref >60)
Glucose, Bld: 83 mg/dL (ref 70–99)
Potassium: 4.1 mmol/L (ref 3.5–5.1)
Sodium: 135 mmol/L (ref 135–145)
Total Bilirubin: 0.7 mg/dL (ref 0.3–1.2)
Total Protein: 7.2 g/dL (ref 6.5–8.1)

## 2021-09-07 LAB — TYPE AND SCREEN
ABO/RH(D): O POS
Antibody Screen: NEGATIVE

## 2021-09-07 LAB — URINALYSIS, ROUTINE W REFLEX MICROSCOPIC
Bacteria, UA: NONE SEEN
Bilirubin Urine: NEGATIVE
Glucose, UA: NEGATIVE mg/dL
Hgb urine dipstick: NEGATIVE
Ketones, ur: NEGATIVE mg/dL
Nitrite: NEGATIVE
Protein, ur: NEGATIVE mg/dL
Specific Gravity, Urine: 1.012 (ref 1.005–1.030)
pH: 7 (ref 5.0–8.0)

## 2021-09-07 LAB — C-REACTIVE PROTEIN: CRP: 0.6 mg/dL (ref <1.0)

## 2021-09-07 LAB — SEDIMENTATION RATE: Sed Rate: 6 mm/hr (ref 0–30)

## 2021-09-07 LAB — SURGICAL PCR SCREEN
MRSA, PCR: NEGATIVE
Staphylococcus aureus: NEGATIVE

## 2021-09-07 NOTE — Patient Instructions (Addendum)
Your procedure is scheduled on: Monday, January 16 Report to the Registration Desk on the 1st floor of the [facility]. To find out your arrival time, please call [phone] between 1PM - 3PM on: Friday, January 13  REMEMBER: Instructions that are not followed completely may result in serious medical risk, up to and including death; or upon the discretion of your surgeon and anesthesiologist your surgery may need to be rescheduled.  Do not eat food after midnight the night before surgery.  No gum chewing, lozengers or hard candies.  You may however, drink CLEAR liquids up to 2 hours before you are scheduled to arrive for your surgery. Do not drink anything within 2 hours of your scheduled arrival time.  Clear liquids include: - water  - apple juice without pulp - gatorade (not RED, PURPLE, OR BLUE) - black coffee or tea (Do NOT add milk or creamers to the coffee or tea) Do NOT drink anything that is not on this list.  In addition, your doctor has ordered for you to drink the provided  Ensure Pre-Surgery Clear Carbohydrate Drink  Drinking this carbohydrate drink up to two hours before surgery helps to reduce insulin resistance and improve patient outcomes. Please complete drinking 2 hours prior to scheduled arrival time.  TAKE THESE MEDICATIONS THE MORNING OF SURGERY WITH A SIP OF WATER:  Carbamazepine (Tegretol) Clonazepam if needed for anxiety Lamotrigine (Lamictal) Metoprolol  One week prior to surgery: starting January 9 Stop aspirin and Anti-inflammatories (NSAIDS) such as Advil, Aleve, Ibuprofen, Motrin, Naproxen, Naprosyn and Aspirin based products such as Excedrin, Goodys Powder, BC Powder. Stop ANY OVER THE COUNTER supplements until after surgery. Stop vitamin D You may however, continue to take Tylenol if needed for pain up until the day of surgery.  No Alcohol for 24 hours before or after surgery.  No Smoking including e-cigarettes for 24 hours prior to surgery.    On the morning of surgery brush your teeth with toothpaste and water, you may rinse your mouth with mouthwash if you wish. Do not swallow any toothpaste or mouthwash.  Use CHG Soap as directed on instruction sheet.  Do not wear jewelry, make-up, hairpins, clips or nail polish.  Do not wear lotions, powders, or perfumes.   Do not shave body from the neck down 48 hours prior to surgery just in case you cut yourself which could leave a site for infection.  Also, freshly shaved skin may become irritated if using the CHG soap.  Contact lenses, hearing aids and dentures may not be worn into surgery.  Do not bring valuables to the hospital. [facility] is not responsible for any missing/lost belongings or valuables.   Notify your doctor if there is any change in your medical condition (cold, fever, infection).  Wear comfortable clothing (specific to your surgery type) to the hospital.  After surgery, you can help prevent lung complications by doing breathing exercises.  Take deep breaths and cough every 1-2 hours. Your doctor may order a device called an Incentive Spirometer to help you take deep breaths.  If you are being admitted to the hospital overnight, leave your suitcase in the car. After surgery it may be brought to your room.  If you are being discharged the day of surgery, you will not be allowed to drive home. You will need a responsible adult (18 years or older) to drive you home and stay with you that night.   If you are taking public transportation, you will need  to have a responsible adult (18 years or older) with you. Please confirm with your physician that it is acceptable to use public transportation.   Please call the [facility] Dept. at [phone] if you have any questions about these instructions.  Surgery Visitation Policy:  Patients undergoing a surgery or procedure may have one family member or support person with them as long as that person is  not COVID-19 positive or experiencing its symptoms.  That person may remain in the waiting area during the procedure and may rotate out with other people.  Inpatient Visitation:    Visiting hours are 7 a.m. to 8 p.m. Up to two visitors ages 16+ are allowed at one time in a patient room. The visitors may rotate out with other people during the day. Visitors must check out when they leave, or other visitors will not be allowed. One designated support person may remain overnight. The visitor must pass COVID-19 screenings, use hand sanitizer when entering and exiting the patients room and wear a mask at all times, including in the patients room. Patients must also wear a mask when staff or their visitor are in the room. Masking is required regardless of vaccination status.

## 2021-09-09 LAB — IGE: IgE (Immunoglobulin E), Serum: 9 IU/mL (ref 6–495)

## 2021-09-16 ENCOUNTER — Other Ambulatory Visit
Admission: RE | Admit: 2021-09-16 | Discharge: 2021-09-16 | Disposition: A | Discharge status: Home / Self Care | Payer: Medicare Other | Source: Ambulatory Visit | Attending: Orthopedic Surgery | Admitting: Orthopedic Surgery

## 2021-09-16 ENCOUNTER — Other Ambulatory Visit: Payer: Self-pay

## 2021-09-16 DIAGNOSIS — Z01812 Encounter for preprocedural laboratory examination: Secondary | ICD-10-CM | POA: Insufficient documentation

## 2021-09-16 DIAGNOSIS — Z20822 Contact with and (suspected) exposure to covid-19: Secondary | ICD-10-CM | POA: Insufficient documentation

## 2021-09-17 ENCOUNTER — Encounter: Payer: Self-pay | Admitting: Orthopedic Surgery

## 2021-09-17 LAB — SARS CORONAVIRUS 2 (TAT 6-24 HRS): SARS Coronavirus 2: NEGATIVE

## 2021-09-19 NOTE — H&P (Signed)
ORTHOPAEDIC HISTORY & PHYSICAL Regino Bellow, PA - 09/08/2021 3:45 PM EST Formatting of this note is different from the original. Images from the original note were not included. Chief Complaint Chief Complaint  Patient presents with   Left Knee - Pain  History & Physical Left TKA 09/20/21 [facility]   Reason for Visit Tabitha Gill is a 76 y.o. who presents today for history and physical. She is to undergo a left total knee arthroplasty on 09/20/2021. Last seen in the clinic on 05/31/2021. Patient did undergo viscosupplementation with no significant provement and wishes to proceed with surgical intervention.  She reports a several year history of left knee pain. She localizes most of the pain along the medial aspect of the knee. She reports no swelling, no locking, and some giving way of the knee. The pain is aggravated by any weight bearing and rising after sitting. The patient has not appreciated any significant improvement despite Tylenol, NSAIDs, intra-articular corticosteroid injections, viscosupplementation, and ambulatory aids. The knee pain limits her ability to ambulate long distances. She is using a cane for ambulation. The patient states that the knee pain has progressed to the point that it is significantly interfering with her activities of daily living.  Past Medical History Past Medical History:  Diagnosis Date   Anxiety   Chicken pox   Depression  depressive syndrome   Esophageal reflux  w/ stricture   GERD (gastroesophageal reflux disease) 10/04/2016   Hyperlipidemia   Hypertension   Menopausal symptom   Past Surgical History Past Surgical History:  Procedure Laterality Date   Bunionectomy Right  big toe   CATARACT SURGERY Bilateral   FOOT SURGERY Bilateral   HYSTERECTOMY   Past Family History Family History  Problem Relation Age of Onset   Stroke Mother   Alzheimer's disease Mother   Medications Current Outpatient Medications Ordered in Epic  Medication Sig  Dispense Refill   aspirin 81 MG EC tablet Take 81 mg by mouth once daily.   biotin 1 mg Cap Take 1 capsule by mouth once daily   carBAMazepine (TEGRETOL) 200 mg tablet Take 50 mg by mouth 2 (two) times daily Take 1/2 tablet by mouth twice a day (am and pm)   carboxymethylcellulose sodium 1 % Drop Apply 1 drop to eye 2 (two) times daily as needed   cholecalciferol (VITAMIN D3) 1000 unit capsule Take 1,000 Units by mouth once daily   clonazePAM (KLONOPIN) 0.5 MG tablet Take 0.5 mg by mouth 2 (two) times daily   hydrocortisone 1 % cream Apply topically   lamoTRIgine (LAMICTAL) 100 MG tablet Take 200 mg by mouth 2 (two) times daily   menthol (BIOFREEZE, MENTHOL,) 4 % Gel Apply 1 Application topically as needed   metoprolol succinate (TOPROL-XL) 50 MG XL tablet TAKE 1 TABLET BY MOUTH ONCE DAILY. (Patient taking differently: Take 50 mg by mouth once daily) 30 tablet 11   metroNIDAZOLE (METROCREAM) 0.75 % cream Apply 1 Application topically as directed 1   mirtazapine (REMERON SOLTAB) 30 MG disintegrating tablet Take 30 mg by mouth at bedtime 0   No current Epic-ordered facility-administered medications on file.   Allergies Allergies  Allergen Reactions   Penicillins Swelling  Has patient had a PCN reaction causing immediate rash, facial/tongue/throat swelling, SOB or lightheadedness with hypotension: No Has patient had a PCN reaction causing severe rash involving mucus membranes or skin necrosis: No Has patient had a PCN reaction that required hospitalization: No Has patient had a PCN reaction occurring within  the last 10 years: No If all of the above answers are "NO", then may proceed with Cephalosporin use.   Bactrim [Sulfamethoxazole-Trimethoprim] Rash   Celexa [Citalopram] Nausea and Vomiting   Diazepam Unknown  Patient unsure of allergy   Duloxetine Hcl Unknown  Patient unsure of allergy   Tetracycline Rash    Review of Systems A comprehensive 14 point ROS was performed, reviewed,  and the pertinent orthopaedic findings are documented in the HPI.  Exam BP 124/76 (BP Location: Left upper arm, Patient Position: Sitting, BP Cuff Size: Adult)   Ht 168.9 cm (5' 6.5")   Wt 85.4 kg (188 lb 3.2 oz)   BMI 29.92 kg/m   General: Well-developed well-nourished female seen in no acute distress.   HEENT: Atraumatic,normocephalic. Pupils are equal and reactive to light. Oropharynx is clear with moist mucosa  Lungs: Clear to auscultation bilaterally   Cardiovascular: Regular rate and rhythm. Normal S1, S2. No murmurs. No appreciable gallops or rubs. Peripheral pulses are palpable.  Abdomen: Soft, non-tender, nondistended. Bowel sounds present  Extremity: Left Knee: Soft tissue swelling: mild Effusion: none Erythema: none Crepitance: mild Tenderness: medial, lateral Alignment: relative valgus Mediolateral laxity: lateral pseudolaxity Posterior sag: negative Patellar tracking: Good tracking without evidence of subluxation or tilt Atrophy: No significant atrophy.  Quadriceps tone was fair to good. Range of motion: 0/0/119 degrees  Neurological:  The patient is alert and oriented Sensation to light touch appears to be intact and within normal limits Gross motor strength appeared to be equal to 5/5  Vascular :  Peripheral pulses felt to be palpable. Capillary refill appears to be intact and within normal limits  X-ray  1. AP standing, lateral and sunrise view of the left knee ordered and interpreted on today's visit shows narrowing of the lateral cartilage space with bone-on-bone articulation and associated valgus alignment. Osteophyte formation as well as subchondral porosis was noted. There was no evidence of any fractures or dislocations.  Impression  1. Degenerative arthrosis left knee  Plan   1. Patient has currently discontinued aspirin and is not taking any other anti-inflammatory medications or anticoagulation medication. 2. Return to clinic 2 weeks  postop. Sooner if any problems 3. Did discuss postop rehab course  This note was generated in part with voice recognition software and I apologize for any typographical errors that were not detected and corrected   Watt Climes PA Electronically signed by Regino Bellow, PA at 09/08/2021 3:50 PM EST

## 2021-09-20 ENCOUNTER — Observation Stay: Payer: Medicare Other

## 2021-09-20 ENCOUNTER — Ambulatory Visit: Payer: Medicare Other | Admitting: Anesthesiology

## 2021-09-20 ENCOUNTER — Ambulatory Visit: Payer: Medicare Other | Admitting: Urgent Care

## 2021-09-20 ENCOUNTER — Observation Stay
Admission: RE | Admit: 2021-09-20 | Discharge: 2021-09-21 | Disposition: A | Discharge status: Home / Self Care | Payer: Medicare Other | Attending: Orthopedic Surgery | Admitting: Orthopedic Surgery

## 2021-09-20 ENCOUNTER — Encounter
Admission: RE | Disposition: A | Discharge status: Home / Self Care | Payer: Self-pay | Source: Home / Self Care | Attending: Orthopedic Surgery

## 2021-09-20 ENCOUNTER — Encounter: Payer: Self-pay | Admitting: Orthopedic Surgery

## 2021-09-20 ENCOUNTER — Other Ambulatory Visit: Payer: Self-pay

## 2021-09-20 DIAGNOSIS — Z96652 Presence of left artificial knee joint: Secondary | ICD-10-CM

## 2021-09-20 DIAGNOSIS — E785 Hyperlipidemia, unspecified: Secondary | ICD-10-CM | POA: Insufficient documentation

## 2021-09-20 DIAGNOSIS — M1712 Unilateral primary osteoarthritis, left knee: Principal | ICD-10-CM | POA: Insufficient documentation

## 2021-09-20 DIAGNOSIS — Z79899 Other long term (current) drug therapy: Secondary | ICD-10-CM | POA: Insufficient documentation

## 2021-09-20 DIAGNOSIS — Z01812 Encounter for preprocedural laboratory examination: Secondary | ICD-10-CM

## 2021-09-20 DIAGNOSIS — Z7982 Long term (current) use of aspirin: Secondary | ICD-10-CM | POA: Diagnosis not present

## 2021-09-20 DIAGNOSIS — Z880 Allergy status to penicillin: Secondary | ICD-10-CM

## 2021-09-20 DIAGNOSIS — N951 Menopausal and female climacteric states: Secondary | ICD-10-CM | POA: Insufficient documentation

## 2021-09-20 DIAGNOSIS — Z96659 Presence of unspecified artificial knee joint: Secondary | ICD-10-CM

## 2021-09-20 DIAGNOSIS — I10 Essential (primary) hypertension: Secondary | ICD-10-CM | POA: Diagnosis not present

## 2021-09-20 DIAGNOSIS — F419 Anxiety disorder, unspecified: Secondary | ICD-10-CM | POA: Insufficient documentation

## 2021-09-20 DIAGNOSIS — F32A Depression, unspecified: Secondary | ICD-10-CM | POA: Insufficient documentation

## 2021-09-20 HISTORY — PX: KNEE ARTHROPLASTY: SHX992

## 2021-09-20 LAB — ABO/RH: ABO/RH(D): O POS

## 2021-09-20 SURGERY — ARTHROPLASTY, KNEE, TOTAL, USING IMAGELESS COMPUTER-ASSISTED NAVIGATION
Anesthesia: General | Site: Knee | Laterality: Left

## 2021-09-20 MED ORDER — MIDAZOLAM HCL 5 MG/5ML IJ SOLN
INTRAMUSCULAR | Status: DC | PRN
  Administered 2021-09-20: 1 mg via INTRAVENOUS

## 2021-09-20 MED ORDER — MAGNESIUM HYDROXIDE 400 MG/5ML PO SUSP: 30.0000 mL | Freq: Every day | ORAL | Status: DC

## 2021-09-20 MED ORDER — SODIUM CHLORIDE 0.9 % IR SOLN
Status: DC | PRN
  Administered 2021-09-20: 3000 mL

## 2021-09-20 MED ORDER — OXYCODONE HCL 5 MG PO TABS
5.0000 mg | ORAL_TABLET | ORAL | Status: DC | PRN
  Administered 2021-09-21: 5 mg via ORAL

## 2021-09-20 MED ORDER — LIDOCAINE HCL (CARDIAC) PF 100 MG/5ML IV SOSY
PREFILLED_SYRINGE | INTRAVENOUS | Status: DC | PRN
  Administered 2021-09-20: 60 mg via INTRAVENOUS

## 2021-09-20 MED ORDER — CHLORHEXIDINE GLUCONATE 0.12 % MT SOLN
OROMUCOSAL | Status: AC
  Administered 2021-09-20: 15 mL via OROMUCOSAL
  Filled 2021-09-20: qty 15

## 2021-09-20 MED ORDER — CEFAZOLIN SODIUM-DEXTROSE 2-4 GM/100ML-% IV SOLN
INTRAVENOUS | Status: AC
  Filled 2021-09-20: qty 100

## 2021-09-20 MED ORDER — METOCLOPRAMIDE HCL 10 MG PO TABS
10.0000 mg | ORAL_TABLET | Freq: Three times a day (TID) | ORAL | Status: DC
  Administered 2021-09-20 – 2021-09-21 (×2): 10 mg via ORAL

## 2021-09-20 MED ORDER — ACETAMINOPHEN 10 MG/ML IV SOLN
INTRAVENOUS | Status: DC | PRN
  Administered 2021-09-20: 1000 mg via INTRAVENOUS

## 2021-09-20 MED ORDER — OXYCODONE HCL 5 MG PO TABS: 10.0000 mg | ORAL_TABLET | ORAL | Status: DC | PRN

## 2021-09-20 MED ORDER — CLONAZEPAM 0.25 MG PO TBDP
0.2500 mg | ORAL_TABLET | Freq: Two times a day (BID) | ORAL | Status: DC
  Administered 2021-09-20 – 2021-09-21 (×2): 0.25 mg via ORAL
  Filled 2021-09-20 (×3): qty 1

## 2021-09-20 MED ORDER — PANTOPRAZOLE SODIUM 40 MG PO TBEC
40.0000 mg | DELAYED_RELEASE_TABLET | Freq: Two times a day (BID) | ORAL | Status: DC
  Administered 2021-09-20 – 2021-09-21 (×2): 40 mg via ORAL
  Filled 2021-09-20 (×5): qty 1

## 2021-09-20 MED ORDER — TRANEXAMIC ACID-NACL 1000-0.7 MG/100ML-% IV SOLN
INTRAVENOUS | Status: AC
  Administered 2021-09-20: 1000 mg via INTRAVENOUS
  Filled 2021-09-20: qty 100

## 2021-09-20 MED ORDER — OXYCODONE HCL 5 MG PO TABS
5.0000 mg | ORAL_TABLET | Freq: Once | ORAL | Status: AC | PRN
  Administered 2021-09-20: 5 mg via ORAL

## 2021-09-20 MED ORDER — TRANEXAMIC ACID-NACL 1000-0.7 MG/100ML-% IV SOLN: 1000.0000 mg | Freq: Once | INTRAVENOUS | Status: AC

## 2021-09-20 MED ORDER — MAGNESIUM HYDROXIDE 400 MG/5ML PO SUSP
ORAL | Status: AC
  Administered 2021-09-20: 30 mL via ORAL
  Filled 2021-09-20: qty 30

## 2021-09-20 MED ORDER — ACETAMINOPHEN 10 MG/ML IV SOLN
1000.0000 mg | Freq: Four times a day (QID) | INTRAVENOUS | Status: DC
  Administered 2021-09-21: 1000 mg via INTRAVENOUS

## 2021-09-20 MED ORDER — MIDAZOLAM HCL 2 MG/2ML IJ SOLN
INTRAMUSCULAR | Status: AC
  Filled 2021-09-20: qty 2

## 2021-09-20 MED ORDER — FAMOTIDINE 20 MG PO TABS
ORAL_TABLET | ORAL | Status: AC
  Administered 2021-09-20: 20 mg via ORAL
  Filled 2021-09-20: qty 1

## 2021-09-20 MED ORDER — ENSURE PRE-SURGERY PO LIQD
296.0000 mL | Freq: Once | ORAL | Status: DC
  Filled 2021-09-20: qty 296

## 2021-09-20 MED ORDER — OXYCODONE HCL 5 MG/5ML PO SOLN
5.0000 mg | Freq: Once | ORAL | Status: AC | PRN
Start: 2021-09-20 — End: 2021-09-20

## 2021-09-20 MED ORDER — BUPIVACAINE HCL (PF) 0.25 % IJ SOLN
INTRAMUSCULAR | Status: DC | PRN
  Administered 2021-09-20: 60 mL

## 2021-09-20 MED ORDER — CEFAZOLIN SODIUM-DEXTROSE 2-4 GM/100ML-% IV SOLN
2.0000 g | INTRAVENOUS | Status: AC
  Administered 2021-09-20: 2 g via INTRAVENOUS

## 2021-09-20 MED ORDER — ENOXAPARIN SODIUM 30 MG/0.3ML IJ SOSY
30.0000 mg | PREFILLED_SYRINGE | Freq: Two times a day (BID) | INTRAMUSCULAR | Status: DC
  Administered 2021-09-21: 30 mg via SUBCUTANEOUS
  Filled 2021-09-20 (×2): qty 0.3

## 2021-09-20 MED ORDER — BUPIVACAINE HCL (PF) 0.5 % IJ SOLN
INTRAMUSCULAR | Status: DC | PRN
  Administered 2021-09-20: 3 mL via INTRATHECAL

## 2021-09-20 MED ORDER — CHLORHEXIDINE GLUCONATE 4 % EX LIQD: 60.0000 mL | Freq: Once | CUTANEOUS | Status: DC

## 2021-09-20 MED ORDER — DIPHENHYDRAMINE HCL 12.5 MG/5ML PO ELIX
12.5000 mg | ORAL_SOLUTION | ORAL | Status: DC | PRN
  Filled 2021-09-20: qty 10

## 2021-09-20 MED ORDER — PROPOFOL 500 MG/50ML IV EMUL
INTRAVENOUS | Status: DC | PRN
  Administered 2021-09-20: 100 ug/kg/min via INTRAVENOUS

## 2021-09-20 MED ORDER — PHENOL 1.4 % MT LIQD
1.0000 | OROMUCOSAL | Status: DC | PRN
Start: 2021-09-20 — End: 2021-09-21
  Filled 2021-09-20: qty 177

## 2021-09-20 MED ORDER — TRAMADOL HCL 50 MG PO TABS: 50.0000 mg | ORAL_TABLET | ORAL | Status: DC | PRN

## 2021-09-20 MED ORDER — ACETAMINOPHEN 325 MG PO TABS: 325.0000 mg | ORAL_TABLET | Freq: Four times a day (QID) | ORAL | Status: DC | PRN

## 2021-09-20 MED ORDER — CELECOXIB 200 MG PO CAPS: 400.0000 mg | ORAL_CAPSULE | Freq: Once | ORAL | Status: AC

## 2021-09-20 MED ORDER — ACETAMINOPHEN 10 MG/ML IV SOLN
INTRAVENOUS | Status: AC
  Administered 2021-09-20: 1000 mg via INTRAVENOUS
  Filled 2021-09-20: qty 100

## 2021-09-20 MED ORDER — CELECOXIB 200 MG PO CAPS
ORAL_CAPSULE | ORAL | Status: AC
  Administered 2021-09-20: 400 mg via ORAL
  Filled 2021-09-20: qty 1

## 2021-09-20 MED ORDER — ONDANSETRON HCL 4 MG/2ML IJ SOLN: 4.0000 mg | Freq: Once | INTRAMUSCULAR | Status: DC | PRN

## 2021-09-20 MED ORDER — SURGIRINSE WOUND IRRIGATION SYSTEM - OPTIME
TOPICAL | Status: DC | PRN
  Administered 2021-09-20: 900 mL via TOPICAL

## 2021-09-20 MED ORDER — PHENYLEPHRINE HCL-NACL 20-0.9 MG/250ML-% IV SOLN
INTRAVENOUS | Status: DC | PRN
  Administered 2021-09-20: 25 ug/min via INTRAVENOUS

## 2021-09-20 MED ORDER — CLONAZEPAM 0.5 MG PO TABS
ORAL_TABLET | ORAL | Status: AC
  Filled 2021-09-20: qty 1

## 2021-09-20 MED ORDER — LACTATED RINGERS IV SOLN: INTRAVENOUS | Status: DC

## 2021-09-20 MED ORDER — FENTANYL CITRATE (PF) 100 MCG/2ML IJ SOLN
INTRAMUSCULAR | Status: DC | PRN
  Administered 2021-09-20: 25 ug via INTRAVENOUS
  Administered 2021-09-20: 50 ug via INTRAVENOUS
  Administered 2021-09-20: 25 ug via INTRAVENOUS

## 2021-09-20 MED ORDER — POLYVINYL ALCOHOL 1.4 % OP SOLN
1.0000 [drp] | Freq: Every day | OPHTHALMIC | Status: DC | PRN
  Filled 2021-09-20: qty 15

## 2021-09-20 MED ORDER — FAMOTIDINE 20 MG PO TABS: 20.0000 mg | ORAL_TABLET | Freq: Once | ORAL | Status: AC

## 2021-09-20 MED ORDER — METOCLOPRAMIDE HCL 10 MG PO TABS
ORAL_TABLET | ORAL | Status: AC
  Filled 2021-09-20: qty 1

## 2021-09-20 MED ORDER — CEFAZOLIN SODIUM-DEXTROSE 2-4 GM/100ML-% IV SOLN
INTRAVENOUS | Status: AC
  Administered 2021-09-20: 2 g via INTRAVENOUS
  Filled 2021-09-20: qty 100

## 2021-09-20 MED ORDER — PHENYLEPHRINE HCL (PRESSORS) 10 MG/ML IV SOLN
INTRAVENOUS | Status: AC
  Filled 2021-09-20: qty 1

## 2021-09-20 MED ORDER — ONDANSETRON HCL 4 MG/2ML IJ SOLN
INTRAMUSCULAR | Status: DC | PRN
  Administered 2021-09-20: 4 mg via INTRAVENOUS

## 2021-09-20 MED ORDER — PROPOFOL 1000 MG/100ML IV EMUL
INTRAVENOUS | Status: AC
  Filled 2021-09-20: qty 100

## 2021-09-20 MED ORDER — CARBAMAZEPINE 200 MG PO TABS
100.0000 mg | ORAL_TABLET | Freq: Two times a day (BID) | ORAL | Status: DC
Start: 2021-09-20 — End: 2021-09-21
  Administered 2021-09-20 – 2021-09-21 (×2): 100 mg via ORAL
  Filled 2021-09-20 (×3): qty 0.5

## 2021-09-20 MED ORDER — TRANEXAMIC ACID-NACL 1000-0.7 MG/100ML-% IV SOLN
INTRAVENOUS | Status: AC
  Filled 2021-09-20: qty 100

## 2021-09-20 MED ORDER — MIRTAZAPINE 15 MG PO TBDP
30.0000 mg | ORAL_TABLET | Freq: Every day | ORAL | Status: DC
  Administered 2021-09-20: 30 mg via ORAL
  Filled 2021-09-20 (×2): qty 2

## 2021-09-20 MED ORDER — BISACODYL 10 MG RE SUPP
10.0000 mg | Freq: Every day | RECTAL | Status: DC | PRN
  Filled 2021-09-20: qty 1

## 2021-09-20 MED ORDER — DEXAMETHASONE SODIUM PHOSPHATE 10 MG/ML IJ SOLN: 8.0000 mg | Freq: Once | INTRAMUSCULAR | Status: AC

## 2021-09-20 MED ORDER — ACETAMINOPHEN 10 MG/ML IV SOLN
INTRAVENOUS | Status: AC
  Filled 2021-09-20: qty 100

## 2021-09-20 MED ORDER — OXYCODONE HCL 5 MG PO TABS
ORAL_TABLET | ORAL | Status: AC
  Administered 2021-09-20: 10 mg via ORAL
  Filled 2021-09-20: qty 2

## 2021-09-20 MED ORDER — BUPIVACAINE HCL (PF) 0.5 % IJ SOLN
INTRAMUSCULAR | Status: AC
  Filled 2021-09-20: qty 10

## 2021-09-20 MED ORDER — GABAPENTIN 300 MG PO CAPS: 300.0000 mg | ORAL_CAPSULE | Freq: Once | ORAL | Status: AC

## 2021-09-20 MED ORDER — DEXAMETHASONE SODIUM PHOSPHATE 10 MG/ML IJ SOLN
INTRAMUSCULAR | Status: AC
  Administered 2021-09-20: 8 mg via INTRAVENOUS
  Filled 2021-09-20: qty 1

## 2021-09-20 MED ORDER — ONDANSETRON HCL 4 MG PO TABS: 4.0000 mg | ORAL_TABLET | Freq: Four times a day (QID) | ORAL | Status: DC | PRN

## 2021-09-20 MED ORDER — CLONAZEPAM 0.5 MG PO TABS
ORAL_TABLET | ORAL | Status: AC
  Administered 2021-09-21: 0.5 mg
  Filled 2021-09-20: qty 1

## 2021-09-20 MED ORDER — TRANEXAMIC ACID-NACL 1000-0.7 MG/100ML-% IV SOLN
1000.0000 mg | INTRAVENOUS | Status: AC
  Administered 2021-09-20: 1000 mg via INTRAVENOUS

## 2021-09-20 MED ORDER — CELECOXIB 200 MG PO CAPS
200.0000 mg | ORAL_CAPSULE | Freq: Two times a day (BID) | ORAL | Status: DC
  Administered 2021-09-20 – 2021-09-21 (×2): 200 mg via ORAL

## 2021-09-20 MED ORDER — ORAL CARE MOUTH RINSE: 15.0000 mL | Freq: Once | OROMUCOSAL | Status: AC

## 2021-09-20 MED ORDER — ACETAMINOPHEN 10 MG/ML IV SOLN: 1000.0000 mg | Freq: Four times a day (QID) | INTRAVENOUS | Status: DC

## 2021-09-20 MED ORDER — FERROUS SULFATE 325 (65 FE) MG PO TABS
325.0000 mg | ORAL_TABLET | Freq: Two times a day (BID) | ORAL | Status: DC
  Administered 2021-09-20 – 2021-09-21 (×2): 325 mg via ORAL
  Filled 2021-09-20 (×3): qty 1

## 2021-09-20 MED ORDER — NEOMYCIN-POLYMYXIN B GU 40-200000 IR SOLN
Status: DC | PRN
  Administered 2021-09-20: 14 mL

## 2021-09-20 MED ORDER — FENTANYL CITRATE (PF) 100 MCG/2ML IJ SOLN
INTRAMUSCULAR | Status: AC
  Filled 2021-09-20: qty 2

## 2021-09-20 MED ORDER — CEFAZOLIN SODIUM-DEXTROSE 2-4 GM/100ML-% IV SOLN
2.0000 g | Freq: Four times a day (QID) | INTRAVENOUS | Status: AC
  Administered 2021-09-20: 2 g via INTRAVENOUS

## 2021-09-20 MED ORDER — ACETAMINOPHEN 10 MG/ML IV SOLN: 1000.0000 mg | Freq: Once | INTRAVENOUS | Status: DC | PRN

## 2021-09-20 MED ORDER — FLEET ENEMA 7-19 GM/118ML RE ENEM: 1.0000 | ENEMA | Freq: Once | RECTAL | Status: DC | PRN

## 2021-09-20 MED ORDER — CELECOXIB 200 MG PO CAPS
ORAL_CAPSULE | ORAL | Status: AC
  Filled 2021-09-20: qty 1

## 2021-09-20 MED ORDER — MENTHOL 3 MG MT LOZG
1.0000 | LOZENGE | OROMUCOSAL | Status: DC | PRN
  Filled 2021-09-20: qty 9

## 2021-09-20 MED ORDER — ONDANSETRON HCL 4 MG/2ML IJ SOLN
INTRAMUSCULAR | Status: AC
  Filled 2021-09-20: qty 2

## 2021-09-20 MED ORDER — METOCLOPRAMIDE HCL 10 MG PO TABS
ORAL_TABLET | ORAL | Status: AC
  Administered 2021-09-20: 10 mg via ORAL
  Filled 2021-09-20: qty 1

## 2021-09-20 MED ORDER — METRONIDAZOLE 0.75 % EX CREA
TOPICAL_CREAM | Freq: Two times a day (BID) | CUTANEOUS | Status: DC
Start: 2021-09-20 — End: 2021-09-21

## 2021-09-20 MED ORDER — 0.9 % SODIUM CHLORIDE (POUR BTL) OPTIME
TOPICAL | Status: DC | PRN
  Administered 2021-09-20: 500 mL

## 2021-09-20 MED ORDER — ALUM & MAG HYDROXIDE-SIMETH 200-200-20 MG/5ML PO SUSP: 30.0000 mL | ORAL | Status: DC | PRN

## 2021-09-20 MED ORDER — SENNOSIDES-DOCUSATE SODIUM 8.6-50 MG PO TABS
1.0000 | ORAL_TABLET | Freq: Two times a day (BID) | ORAL | Status: DC
  Administered 2021-09-20 – 2021-09-21 (×3): 1 via ORAL
  Filled 2021-09-20 (×4): qty 1

## 2021-09-20 MED ORDER — VITAMIN D3 25 MCG (1000 UNIT) PO TABS
5000.0000 [IU] | ORAL_TABLET | Freq: Every day | ORAL | Status: DC
  Filled 2021-09-20 (×2): qty 5

## 2021-09-20 MED ORDER — GABAPENTIN 300 MG PO CAPS
ORAL_CAPSULE | ORAL | Status: AC
  Administered 2021-09-20: 300 mg via ORAL
  Filled 2021-09-20: qty 1

## 2021-09-20 MED ORDER — CHLORHEXIDINE GLUCONATE 0.12 % MT SOLN: 15.0000 mL | Freq: Once | OROMUCOSAL | Status: AC

## 2021-09-20 MED ORDER — OXYCODONE HCL 5 MG PO TABS
ORAL_TABLET | ORAL | Status: AC
  Filled 2021-09-20: qty 1

## 2021-09-20 MED ORDER — HYDROCERIN EX CREA
1.0000 "application " | TOPICAL_CREAM | CUTANEOUS | Status: DC | PRN
  Filled 2021-09-20: qty 113

## 2021-09-20 MED ORDER — SODIUM CHLORIDE 0.9 % IV SOLN
INTRAVENOUS | Status: DC | PRN
  Administered 2021-09-20: 60 mL

## 2021-09-20 MED ORDER — LAMOTRIGINE 100 MG PO TABS
200.0000 mg | ORAL_TABLET | Freq: Two times a day (BID) | ORAL | Status: DC
  Administered 2021-09-20 – 2021-09-21 (×2): 200 mg via ORAL
  Filled 2021-09-20 (×3): qty 2

## 2021-09-20 MED ORDER — EPINEPHRINE PF 1 MG/ML IJ SOLN
INTRAMUSCULAR | Status: AC
  Filled 2021-09-20: qty 1

## 2021-09-20 MED ORDER — FENTANYL CITRATE (PF) 100 MCG/2ML IJ SOLN: 25.0000 ug | INTRAMUSCULAR | Status: DC | PRN

## 2021-09-20 MED ORDER — HYDROMORPHONE HCL 1 MG/ML IJ SOLN
0.5000 mg | INTRAMUSCULAR | Status: DC | PRN
  Administered 2021-09-20: 1 mg via INTRAVENOUS

## 2021-09-20 MED ORDER — HYDROMORPHONE HCL 1 MG/ML IJ SOLN
INTRAMUSCULAR | Status: AC
  Filled 2021-09-20: qty 1

## 2021-09-20 MED ORDER — METOPROLOL SUCCINATE ER 25 MG PO TB24
50.0000 mg | ORAL_TABLET | Freq: Every day | ORAL | Status: DC
  Administered 2021-09-21: 50 mg via ORAL
  Filled 2021-09-20: qty 2

## 2021-09-20 MED ORDER — SODIUM CHLORIDE 0.9 % IV SOLN: INTRAVENOUS | Status: DC

## 2021-09-20 MED ORDER — ONDANSETRON HCL 4 MG/2ML IJ SOLN: 4.0000 mg | Freq: Four times a day (QID) | INTRAMUSCULAR | Status: DC | PRN

## 2021-09-20 SURGICAL SUPPLY — 78 items
ATTUNE PSFEM LTSZ6 NARCEM KNEE (Femur) ×1 IMPLANT
ATTUNE PSRP INSR SZ6 7 KNEE (Insert) ×1 IMPLANT
BASE TIBIAL ROT PLAT SZ 5 KNEE (Knees) IMPLANT
BATTERY INSTRU NAVIGATION (MISCELLANEOUS) ×8 IMPLANT
BLADE SAW 70X12.5 (BLADE) ×2 IMPLANT
BLADE SAW 90X13X1.19 OSCILLAT (BLADE) ×2 IMPLANT
BLADE SAW 90X25X1.19 OSCILLAT (BLADE) ×2 IMPLANT
BONE CEMENT GENTAMICIN (Cement) ×4 IMPLANT
CEMENT BONE GENTAMICIN 40 (Cement) IMPLANT
COOLER POLAR GLACIER W/PUMP (MISCELLANEOUS) ×2 IMPLANT
CUFF TOURN SGL QUICK 24 (TOURNIQUET CUFF)
CUFF TOURN SGL QUICK 34 (TOURNIQUET CUFF)
CUFF TRNQT CYL 24X4X16.5-23 (TOURNIQUET CUFF) IMPLANT
CUFF TRNQT CYL 34X4.125X (TOURNIQUET CUFF) IMPLANT
DRAPE 3/4 80X56 (DRAPES) ×2 IMPLANT
DRAPE INCISE IOBAN 66X45 STRL (DRAPES) ×2 IMPLANT
DRSG DERMACEA 8X12 NADH (GAUZE/BANDAGES/DRESSINGS) ×2 IMPLANT
DRSG MEPILEX SACRM 8.7X9.8 (GAUZE/BANDAGES/DRESSINGS) ×2 IMPLANT
DRSG OPSITE POSTOP 4X12 (GAUZE/BANDAGES/DRESSINGS) ×1 IMPLANT
DRSG OPSITE POSTOP 4X14 (GAUZE/BANDAGES/DRESSINGS) ×2 IMPLANT
DRSG TEGADERM 4X4.75 (GAUZE/BANDAGES/DRESSINGS) ×2 IMPLANT
DURAPREP 26ML APPLICATOR (WOUND CARE) ×4 IMPLANT
ELECT CAUTERY BLADE 6.4 (BLADE) ×2 IMPLANT
ELECT REM PT RETURN 9FT ADLT (ELECTROSURGICAL) ×2
ELECTRODE REM PT RTRN 9FT ADLT (ELECTROSURGICAL) ×1 IMPLANT
EX-PIN ORTHOLOCK NAV 4X150 (PIN) ×4 IMPLANT
GLOVE SRG 8 PF TXTR STRL LF DI (GLOVE) ×1 IMPLANT
GLOVE SURG ENC TEXT LTX SZ7.5 (GLOVE) ×4 IMPLANT
GLOVE SURG UNDER POLY LF SZ7.5 (GLOVE) ×2 IMPLANT
GLOVE SURG UNDER POLY LF SZ8 (GLOVE) ×1
GOWN STRL REUS W/ TWL LRG LVL3 (GOWN DISPOSABLE) ×2 IMPLANT
GOWN STRL REUS W/ TWL XL LVL3 (GOWN DISPOSABLE) ×1 IMPLANT
GOWN STRL REUS W/TWL LRG LVL3 (GOWN DISPOSABLE) ×2
GOWN STRL REUS W/TWL XL LVL3 (GOWN DISPOSABLE) ×1
HEMOVAC 400CC 10FR (MISCELLANEOUS) ×2 IMPLANT
HOLDER FOLEY CATH W/STRAP (MISCELLANEOUS) ×2 IMPLANT
HOLSTER ELECTROSUGICAL PENCIL (MISCELLANEOUS) ×2 IMPLANT
IV NS IRRIG 3000ML ARTHROMATIC (IV SOLUTION) ×2 IMPLANT
KIT TURNOVER KIT A (KITS) ×2 IMPLANT
KNIFE SCULPS 14X20 (INSTRUMENTS) ×2 IMPLANT
LABEL OR SOLS (LABEL) ×2 IMPLANT
MANIFOLD NEPTUNE II (INSTRUMENTS) ×4 IMPLANT
NDL SAFETY ECLIPSE 18X1.5 (NEEDLE) ×1 IMPLANT
NDL SPNL 20GX3.5 QUINCKE YW (NEEDLE) ×2 IMPLANT
NEEDLE HYPO 18GX1.5 SHARP (NEEDLE) ×1
NEEDLE SPNL 20GX3.5 QUINCKE YW (NEEDLE) ×4 IMPLANT
NS IRRIG 500ML POUR BTL (IV SOLUTION) ×2 IMPLANT
PACK TOTAL KNEE (MISCELLANEOUS) ×2 IMPLANT
PAD ABD DERMACEA PRESS 5X9 (GAUZE/BANDAGES/DRESSINGS) ×4 IMPLANT
PAD WRAPON POLAR KNEE (MISCELLANEOUS) ×1 IMPLANT
PATELLA MEDIAL ATTUN 35MM KNEE (Knees) ×1 IMPLANT
PENCIL SMOKE EVACUATOR COATED (MISCELLANEOUS) ×2 IMPLANT
PIN DRILL FIX HALF THREAD (BIT) ×4 IMPLANT
PIN DRILL QUICK PACK ×3 IMPLANT
PIN FIXATION 1/8DIA X 3INL (PIN) ×2 IMPLANT
PULSAVAC PLUS IRRIG FAN TIP (DISPOSABLE) ×2
SOL PREP PVP 2OZ (MISCELLANEOUS) ×2
SOLUTION PREP PVP 2OZ (MISCELLANEOUS) ×1 IMPLANT
SOLUTION PRONTOSAN WOUND 350ML (IRRIGATION / IRRIGATOR) ×2 IMPLANT
SPONGE DRAIN TRACH 4X4 STRL 2S (GAUZE/BANDAGES/DRESSINGS) ×2 IMPLANT
SPONGE T-LAP 18X18 ~~LOC~~+RFID (SPONGE) ×6 IMPLANT
STAPLER SKIN PROX 35W (STAPLE) ×2 IMPLANT
STOCKINETTE IMPERV 14X48 (MISCELLANEOUS) ×1 IMPLANT
STRAP TIBIA SHORT (MISCELLANEOUS) ×2 IMPLANT
SUCTION FRAZIER HANDLE 10FR (MISCELLANEOUS) ×1
SUCTION TUBE FRAZIER 10FR DISP (MISCELLANEOUS) ×1 IMPLANT
SUT VIC AB 0 CT1 36 (SUTURE) ×4 IMPLANT
SUT VIC AB 1 CT1 36 (SUTURE) ×4 IMPLANT
SUT VIC AB 2-0 CT2 27 (SUTURE) ×2 IMPLANT
SYR 20ML LL LF (SYRINGE) ×2 IMPLANT
SYR 30ML LL (SYRINGE) ×4 IMPLANT
TIBIAL BASE ROT PLAT SZ 5 KNEE (Knees) ×2 IMPLANT
TIP FAN IRRIG PULSAVAC PLUS (DISPOSABLE) ×1 IMPLANT
TOWEL OR 17X26 4PK STRL BLUE (TOWEL DISPOSABLE) ×2 IMPLANT
TOWER CARTRIDGE SMART MIX (DISPOSABLE) ×2 IMPLANT
TRAY FOLEY MTR SLVR 16FR STAT (SET/KITS/TRAYS/PACK) ×2 IMPLANT
WATER STERILE IRR 500ML POUR (IV SOLUTION) ×2 IMPLANT
WRAPON POLAR PAD KNEE (MISCELLANEOUS) ×2

## 2021-09-20 NOTE — Anesthesia Postprocedure Evaluation (Signed)
Anesthesia Post Note  Patient: Tabitha Gill  Procedure(s) Performed: COMPUTER ASSISTED TOTAL KNEE ARTHROPLASTY (Left: Knee)  Patient location during evaluation: PACU Anesthesia Type: Spinal Level of consciousness: awake and alert, oriented and patient cooperative Pain management: pain level controlled Vital Signs Assessment: post-procedure vital signs reviewed and stable Respiratory status: spontaneous breathing, nonlabored ventilation and respiratory function stable Cardiovascular status: blood pressure returned to baseline and stable Postop Assessment: adequate PO intake Anesthetic complications: no   No notable events documented.   Last Vitals:  Vitals:   09/20/21 1139 09/20/21 1145  BP:    Pulse:  73  Resp:  20  Temp:    SpO2: 98% 99%    Last Pain:  Vitals:   09/20/21 1145  TempSrc:   PainSc: [place]

## 2021-09-20 NOTE — H&P (Signed)
The patient has been re-examined, and the chart reviewed, and there have been no interval changes to the documented history and physical.    The risks, benefits, and alternatives have been discussed at length. The patient expressed understanding of the risks benefits and agreed with plans for surgical intervention.  Epic Tribbett P. Aariana Shankland, Jr. M.D.    

## 2021-09-20 NOTE — TOC Progression Note (Signed)
Transition of Care [facility]) - Progression Note    Patient Details  Name: Tabitha Gill MRN: [id number] Date of Birth: December 07, 1945  Transition of Care [facility]) CM/SW [place], RN Phone Number: 09/20/2021, 5:18 PM  Clinical Narrative:    Met with the patient at the bedside, she needs a 3 in 1, [employer] will deliver in the morning to the bedside, She has a rolling walker at home, Her husband provides transportation and she has help at home, She is set up with [place] for [facility] services, [place] letter reviewed        Expected Discharge Plan and Services                                                 Social Determinants of Health (SDOH) Interventions    Readmission Risk Interventions No flowsheet data found.

## 2021-09-20 NOTE — Evaluation (Signed)
Physical Therapy Evaluation Patient Details Name: Tabitha Gill MRN: [id number] DOB: 1946/03/14 Today's Date: 09/20/2021  History of Present Illness  76 y/o female s/p L TKA on 09/20/21.  Clinical Impression  Pt received supine in bed, son at bedside, agreeable to therapy. Pt lives in a single level home with her husband who comes and goes from the house; she has other friends and family who will be available as needed. She has 1 step + 1 step to enter home. Pt performed bed mobility with CGA, STS with MIN A and 45ft ambulation with CGA. She did report symptoms resembling orthostatic hypotension. Symptoms cleared within 1-2 minutes of both sitting up and standing. Pt ambulated with gait deficits including LLE slightly abducted, bilateral pronation and mild bilateral toe out. TKA packet provided; will review next session. Overall deficits include decreased strength/ROM of LLE, increased pain, decreased mobility and gait deficits. Would benefit from skilled PT to address above deficits and promote optimal return to PLOF.      Recommendations for follow up therapy are one component of a multi-disciplinary discharge planning process, led by the attending physician.  Recommendations may be updated based on patient status, additional functional criteria and insurance authorization.  Follow Up Recommendations Home health PT    Assistance Recommended at Discharge Intermittent Supervision/Assistance  Patient can return home with the following  A little help with walking and/or transfers;Assistance with cooking/housework;Assist for transportation;A little help with bathing/dressing/bathroom;Help with stairs or ramp for entrance    Equipment Recommendations BSC/3in1 (Pt owns RW.)  Recommendations for Other Services       Functional Status Assessment Patient has had a recent decline in their functional status and demonstrates the ability to make significant improvements in function in a reasonable and  predictable amount of time.     Precautions / Restrictions Precautions Precautions: Fall Restrictions Weight Bearing Restrictions: Yes LLE Weight Bearing: Weight bearing as tolerated      Mobility  Bed Mobility Overal bed mobility: Needs Assistance Bed Mobility: Supine to Sit     Supine to sit: Min guard;HOB elevated     General bed mobility comments: pt reporting dizziness, CGA for safety. Pt used bed features.    Transfers Overall transfer level: Needs assistance Equipment used: Rolling walker (2 wheels) Transfers: Sit to/from Stand Sit to Stand: Min assist           General transfer comment: Increased steadying as pt transitioned hand to RW. VC on sequencing.    Ambulation/Gait Ambulation/Gait assistance: Min guard Gait Distance (Feet): 30 Feet Assistive device: Rolling walker (2 wheels) Gait Pattern/deviations: Step-to pattern;Decreased stride length;Decreased stance time - left;Decreased weight shift to left;Antalgic;Wide base of support       General Gait Details: Pt ambulated with LLE slightly abducted; VC to pull LLE under hips. Pronation of bilateral feet with mild toe out.  Stairs            Wheelchair Mobility    Modified Rankin (Stroke Patients Only)       Balance Overall balance assessment: Needs assistance Sitting-balance support: No upper extremity supported;Feet supported Sitting balance-Leahy Scale: Good Sitting balance - Comments: CGA progressed to Mod I as OH symptoms cleared.   Standing balance support: Bilateral upper extremity supported;During functional activity;Reliant on assistive device for balance Standing balance-Leahy Scale: Poor Standing balance comment: RW and CGA to ambulate.  Pertinent Vitals/Pain Pain Assessment: 0-10 Pain Score: 6  Pain Location: L knee and thigh Pain Descriptors / Indicators: Aching;Discomfort Pain Intervention(s): Limited activity within patient's  tolerance;Monitored during session;Premedicated before session;Repositioned    Home Living Family/patient expects to be discharged to:: Private residence Living Arrangements: Spouse/significant other Available Help at Discharge: Family;Available PRN/intermittently Type of Home: House Home Access: Stairs to enter   [employer] of Steps: 1 (no rail) + 1 (w/ railing)   Home Layout: One level Home Equipment: Conservation officer, nature (2 wheels);Cane - single point Additional Comments: Has been using cane for ambulation. Husband is "in-and-out of the house." DIL and cleaning lady will both be stopping by to assist pt as needed.    Prior Function Prior Level of Function : Independent/Modified Independent             Mobility Comments: Was Mod I using SPC. Endorses 1 fall in which she tripped over her shoe. ADLs Comments: Takes a bird bath at the sink, is afraid of falling in the shower. Is independent with all ADLs. Company comes to house to clean.     Hand Dominance        Extremity/Trunk Assessment   Upper Extremity Assessment Upper Extremity Assessment: Overall WFL for tasks assessed    Lower Extremity Assessment Lower Extremity Assessment: LLE deficits/detail;Overall WFL for tasks assessed LLE Deficits / Details: L TKA       Communication   Communication: No difficulties  Cognition Arousal/Alertness: Awake/alert Behavior During Therapy: WFL for tasks assessed/performed Overall Cognitive Status: Within Functional Limits for tasks assessed                                          General Comments      Exercises Other Exercises Other Exercises: PT educated on role of PT, POC, polar care, knee extension via bone foam/rolled blanket/flat bed, WB status, performance at end of session and safety precautions to only mobilize with hospital staff. All questions answered at end of session.   Assessment/Plan    PT Assessment Patient needs continued PT  services  PT Problem List Decreased strength;Decreased mobility;Decreased range of motion;Decreased activity tolerance;Decreased balance       PT Treatment Interventions DME instruction;Therapeutic activities;Gait training;Therapeutic exercise;Patient/family education;Stair training;Balance training;Functional mobility training    PT Goals (Current goals can be found in the Care Plan section)  Acute Rehab PT Goals Patient Stated Goal: to go home PT Goal Formulation: With patient Time For Goal Achievement: 10/04/21 Potential to Achieve Goals: Good    Frequency BID     Co-evaluation               AM-PAC PT "6 Clicks" Mobility  Outcome Measure Help needed turning from your back to your side while in a flat bed without using bedrails?: A Little Help needed moving from lying on your back to sitting on the side of a flat bed without using bedrails?: A Little Help needed moving to and from a bed to a chair (including a wheelchair)?: A Little Help needed standing up from a chair using your arms (e.g., wheelchair or bedside chair)?: A Little Help needed to walk in hospital room?: A Little Help needed climbing 3-5 steps with a railing? : A Lot 6 Click Score: 17    End of Session Equipment Utilized During Treatment: Gait belt Activity Tolerance: Patient tolerated treatment well Patient left: in chair;with  call bell/phone within reach;with family/visitor present Nurse Communication: Mobility status;Precautions PT Visit Diagnosis: Unsteadiness on feet (R26.81);Muscle weakness (generalized) (M62.81);Other abnormalities of gait and mobility (R26.89);Difficulty in walking, not elsewhere classified (R26.2);Pain Pain - Right/Left: Left Pain - part of body: Knee    Time: 6088-8358 PT Time Calculation (min) (ACUTE ONLY): 32 min   Charges:   PT Evaluation $PT Eval Low Complexity: 1 Low PT Treatments $Gait Training: 8-22 mins $Therapeutic Activity: 8-22 mins        Patrina Levering  PT, DPT 09/20/21 3:36 PM [phone]

## 2021-09-20 NOTE — Care Management Obs Status (Signed)
[facility] NOTIFICATION   Patient Details  Name: Tabitha Gill MRN: [id number] Date of Birth: [birth year]   Medicare Observation Status Notification Given:  Yes    Conception Oms, RN 09/20/2021, 5:17 PM

## 2021-09-20 NOTE — TOC CM/SW Note (Signed)
Notified by [place] patient is already engaged with [place] for [facility] services. TOC will order 3-1 BSC. Patient already has walker at home.

## 2021-09-20 NOTE — Op Note (Signed)
OPERATIVE NOTE  DATE OF SURGERY:  09/20/2021  PATIENT NAME:  Tabitha Gill   DOB: 03/14/1946  MRN: [id number]  PRE-OPERATIVE DIAGNOSIS: Degenerative arthrosis of the left knee, primary  POST-OPERATIVE DIAGNOSIS:  Same  PROCEDURE:  Left total knee arthroplasty using computer-assisted navigation  SURGEON:  Marciano Sequin. M.D.  ASSISTANT: Cassell Smiles, PA-C (present and scrubbed throughout the case, critical for assistance with exposure, retraction, instrumentation, and closure)  ANESTHESIA: spinal  ESTIMATED BLOOD LOSS: 50 mL  FLUIDS REPLACED: 700 mL of crystalloid  TOURNIQUET TIME: 89 minutes  DRAINS: 2 medium Hemovac drains  SOFT TISSUE RELEASES: Anterior cruciate ligament, posterior cruciate ligament, deep medial collateral ligament, patellofemoral ligament  IMPLANTS UTILIZED: DePuy Attune size 6N posterior stabilized femoral component (cemented), size 5 rotating platform tibial component (cemented), 35 mm medialized dome patella (cemented), and a 7 mm stabilized rotating platform polyethylene insert.  INDICATIONS FOR SURGERY: Tabitha Gill is a 76 y.o. year old female with a long history of progressive knee pain. X-rays demonstrated severe degenerative changes in tricompartmental fashion. The patient had not seen any significant improvement despite conservative nonsurgical intervention. After discussion of the risks and benefits of surgical intervention, the patient expressed understanding of the risks benefits and agree with plans for total knee arthroplasty.   The risks, benefits, and alternatives were discussed at length including but not limited to the risks of infection, bleeding, nerve injury, stiffness, blood clots, the need for revision surgery, cardiopulmonary complications, among others, and they were willing to proceed.  PROCEDURE IN DETAIL: The patient was brought into the operating room and, after adequate spinal anesthesia was achieved, a tourniquet was placed on  the patient's upper thigh. The patient's knee and leg were cleaned and prepped with alcohol and DuraPrep and draped in the usual sterile fashion. A "timeout" was performed as per usual protocol. The lower extremity was exsanguinated using an Esmarch, and the tourniquet was inflated to 300 mmHg. An anterior longitudinal incision was made followed by a standard mid vastus approach. The deep fibers of the medial collateral ligament were elevated in a subperiosteal fashion off of the medial flare of the tibia so as to maintain a continuous soft tissue sleeve. The patella was subluxed laterally and the patellofemoral ligament was incised. Inspection of the knee demonstrated severe degenerative changes with full-thickness loss of articular cartilage. Osteophytes were debrided using a rongeur. Anterior and posterior cruciate ligaments were excised. Two 4.0 mm Schanz pins were inserted in the femur and into the tibia for attachment of the array of trackers used for computer-assisted navigation. Hip center was identified using a circumduction technique. Distal landmarks were mapped using the computer. The distal femur and proximal tibia were mapped using the computer. The distal femoral cutting guide was positioned using computer-assisted navigation so as to achieve a 5 distal valgus cut. The femur was sized and it was felt that a size 6N femoral component was appropriate. A size 6 femoral cutting guide was positioned and the anterior cut was performed and verified using the computer. This was followed by completion of the posterior and chamfer cuts. Femoral cutting guide for the central box was then positioned in the center box cut was performed.  Attention was then directed to the proximal tibia. Medial and lateral menisci were excised. The extramedullary tibial cutting guide was positioned using computer-assisted navigation so as to achieve a 0 varus-valgus alignment and 3 posterior slope. The cut was performed and  verified using the computer. The proximal tibia was  sized and it was felt that a size 5 tibial tray was appropriate. Tibial and femoral trials were inserted followed by insertion of a 7 mm polyethylene insert. This allowed for excellent mediolateral soft tissue balancing both in flexion and in full extension. Finally, the patella was cut and prepared so as to accommodate a 35 mm medialized dome patella. A patella trial was placed and the knee was placed through a range of motion with excellent patellar tracking appreciated. The femoral trial was removed after debridement of posterior osteophytes. The central post-hole for the tibial component was reamed followed by insertion of a keel punch. Tibial trials were then removed. Cut surfaces of bone were irrigated with copious amounts of normal saline using pulsatile lavage and then suctioned dry. Polymethylmethacrylate cement with gentamicin was prepared in the usual fashion using a vacuum mixer. Cement was applied to the cut surface of the proximal tibia as well as along the undersurface of a size 5 rotating platform tibial component. Tibial component was positioned and impacted into place. Excess cement was removed using [employer] fast streamer. Cement was then applied to the cut surfaces of the femur as well as along the posterior flanges of the size 6N femoral component. The femoral component was positioned and impacted into place. Excess cement was removed using [employer] fast streamer. A 7 mm polyethylene trial was inserted and the knee was brought into full extension with steady axial compression applied. Finally, cement was applied to the backside of a 35 mm medialized dome patella and the patellar component was positioned and patellar clamp applied. Excess cement was removed using [employer] fast streamer. After adequate curing of the cement, the tourniquet was deflated after a total tourniquet time of 89 minutes. Hemostasis was achieved using electrocautery. The knee was irrigated  with copious amounts of normal saline using pulsatile lavage followed by 500 mL of Surgiphor and then suctioned dry. 20 mL of 1.3% Exparel and 60 mL of 0.25% Marcaine in 40 mL of normal saline was injected along the posterior capsule, medial and lateral gutters, and along the arthrotomy site. A 7 mm stabilized rotating platform polyethylene insert was inserted and the knee was placed through a range of motion with excellent mediolateral soft tissue balancing appreciated and excellent patellar tracking noted. 2 medium drains were placed in the wound bed and brought out through separate stab incisions. The medial parapatellar portion of the incision was reapproximated using interrupted sutures of #1 Vicryl. Subcutaneous tissue was approximated in layers using first #0 Vicryl followed #2-0 Vicryl. The skin was approximated with skin staples. A sterile dressing was applied.  The patient tolerated the procedure well and was transported to the recovery room in stable condition.    Marielys Trinidad P. Holley Bouche., M.D.

## 2021-09-20 NOTE — Anesthesia Preprocedure Evaluation (Signed)
Anesthesia Evaluation  Patient identified by MRN, date of birth, ID band Patient awake    Reviewed: Allergy & Precautions, NPO status , Patient's Chart, lab work & pertinent test results  History of Anesthesia Complications Negative for: history of anesthetic complications  Airway Mallampati: III   Neck ROM: Full    Dental no notable dental hx.    Pulmonary neg pulmonary ROS,    Pulmonary exam normal breath sounds clear to auscultation       Cardiovascular hypertension, Normal cardiovascular exam Rhythm:Regular Rate:Normal     Neuro/Psych PSYCHIATRIC DISORDERS (panic disorder) Anxiety Depression negative neurological ROS     GI/Hepatic GERD  ,  Endo/Other  negative endocrine ROS  Renal/GU Renal disease (stage III CKD)     Musculoskeletal  (+) Arthritis , Skin CA   Abdominal   Peds  Hematology negative hematology ROS (+)   Anesthesia Other Findings   Reproductive/Obstetrics                             Anesthesia Physical Anesthesia Plan  ASA: 2  Anesthesia Plan: General and Spinal   Post-op Pain Management:    Induction: Intravenous  PONV Risk Score and Plan: 3 and Propofol infusion, TIVA, Treatment may vary due to age or medical condition and Ondansetron  Airway Management Planned: Natural Airway and Nasal Cannula  Additional Equipment:   Intra-op Plan:   Post-operative Plan:   Informed Consent: I have reviewed the patients History and Physical, chart, labs and discussed the procedure including the risks, benefits and alternatives for the proposed anesthesia with the patient or authorized representative who has indicated his/her understanding and acceptance.       Plan Discussed with: CRNA  Anesthesia Plan Comments: (Plan for spinal and GA with natural airway, LMA/GETA backup.  Patient consented for risks of anesthesia including but not limited to:  - adverse  reactions to medications - damage to eyes, teeth, lips or other oral mucosa - nerve damage due to positioning  - sore throat or hoarseness - headache, bleeding, infection, nerve damage 2/2 spinal - damage to heart, brain, nerves, lungs, other parts of body or loss of life  Informed patient about role of CRNA in peri- and intra-operative care.  Patient voiced understanding.)        Anesthesia Quick Evaluation

## 2021-09-20 NOTE — Anesthesia Procedure Notes (Addendum)
Spinal  Patient location during procedure: OR Start time: 09/20/2021 7:24 AM End time: 09/20/2021 7:29 AM Reason for block: surgical anesthesia Staffing Performed: resident/CRNA  Resident/CRNA: Loletha Grayer, CRNA Preanesthetic Checklist Completed: patient identified, IV checked, site marked, risks and benefits discussed, surgical consent, monitors and equipment checked, pre-op evaluation and timeout performed Spinal Block Patient position: sitting Prep: Betadine Patient monitoring: heart rate, continuous pulse ox, blood pressure and cardiac monitor Approach: midline Location: L4-5 Injection technique: single-shot Needle Needle type: Whitacre  Needle gauge: 22 G Needle length: 9 cm Assessment Sensory level: T10 Events: CSF return Additional Notes L sided paresthesia-resolved. Negative blood return. Positive free-flowing CSF. Expiration date of kit checked and confirmed. Patient tolerated procedure well, without complications.

## 2021-09-20 NOTE — Anesthesia Procedure Notes (Signed)
Spinal

## 2021-09-20 NOTE — Transfer of Care (Signed)
Immediate Anesthesia Transfer of Care Note  Patient: Tabitha Gill [facility]  Procedure(s) Performed: COMPUTER ASSISTED TOTAL KNEE ARTHROPLASTY (Left: Knee)  Patient Location: PACU  Anesthesia Type:GA combined with regional for post-op pain  Level of Consciousness: awake and drowsy  Airway & Oxygen Therapy: Patient Spontanous Breathing and Patient connected to face mask oxygen  Post-op Assessment: Report given to RN and Post -op Vital signs reviewed and stable  Post vital signs: Reviewed and stable  Last Vitals:  Vitals Value Taken Time  BP 113/75 09/20/21 1100  Temp    Pulse 67 09/20/21 1105  Resp 19 09/20/21 1105  SpO2 99 % 09/20/21 1105  Vitals shown include unvalidated device data.  Last Pain:  Vitals:   09/20/21 0619  TempSrc: Oral         Complications: No notable events documented.

## 2021-09-21 ENCOUNTER — Encounter: Payer: Self-pay | Admitting: Orthopedic Surgery

## 2021-09-21 DIAGNOSIS — M1712 Unilateral primary osteoarthritis, left knee: Secondary | ICD-10-CM | POA: Diagnosis not present

## 2021-09-21 MED ORDER — CLONAZEPAM 0.5 MG PO TABS
ORAL_TABLET | ORAL | Status: AC
  Filled 2021-09-21: qty 1

## 2021-09-21 MED ORDER — OXYCODONE HCL 5 MG PO TABS: 5.0000 mg | ORAL_TABLET | ORAL | 0 refills | Status: DC | PRN

## 2021-09-21 MED ORDER — ACETAMINOPHEN 10 MG/ML IV SOLN: 1000.0000 mg | Freq: Four times a day (QID) | INTRAVENOUS | Status: DC

## 2021-09-21 MED ORDER — METOCLOPRAMIDE HCL 10 MG PO TABS
ORAL_TABLET | ORAL | Status: AC
  Filled 2021-09-21: qty 1

## 2021-09-21 MED ORDER — METOCLOPRAMIDE HCL 10 MG PO TABS
ORAL_TABLET | ORAL | Status: AC
  Administered 2021-09-21: 10 mg via ORAL
  Filled 2021-09-21: qty 1

## 2021-09-21 MED ORDER — OXYCODONE HCL 5 MG PO TABS
ORAL_TABLET | ORAL | Status: AC
  Filled 2021-09-21: qty 1

## 2021-09-21 MED ORDER — TRAMADOL HCL 50 MG PO TABS: 50.0000 mg | ORAL_TABLET | ORAL | 0 refills | Status: DC | PRN

## 2021-09-21 MED ORDER — ENOXAPARIN SODIUM 40 MG/0.4ML IJ SOSY
40.0000 mg | PREFILLED_SYRINGE | INTRAMUSCULAR | 0 refills | Status: DC
Start: 2021-09-21 — End: 2022-11-28

## 2021-09-21 MED ORDER — MAGNESIUM HYDROXIDE 400 MG/5ML PO SUSP
ORAL | Status: AC
  Filled 2021-09-21: qty 30

## 2021-09-21 MED ORDER — ACETAMINOPHEN 10 MG/ML IV SOLN
INTRAVENOUS | Status: AC
  Administered 2021-09-21: 1000 mg
  Filled 2021-09-21: qty 100

## 2021-09-21 MED ORDER — ACETAMINOPHEN 10 MG/ML IV SOLN
INTRAVENOUS | Status: AC
  Filled 2021-09-21: qty 100

## 2021-09-21 MED ORDER — CELECOXIB 200 MG PO CAPS
ORAL_CAPSULE | ORAL | Status: AC
  Filled 2021-09-21: qty 1

## 2021-09-21 MED ORDER — CELECOXIB 200 MG PO CAPS: 200.0000 mg | ORAL_CAPSULE | Freq: Two times a day (BID) | ORAL | 0 refills | Status: DC

## 2021-09-21 NOTE — Progress Notes (Signed)
°  Subjective: 1 Day Post-Op Procedure(s) (LRB): COMPUTER ASSISTED TOTAL KNEE ARTHROPLASTY (Left) Patient reports pain as well-controlled.   Patient is well, and has had no acute complaints or problems Plan is to go Home after hospital stay. Negative for chest pain and shortness of breath Fever: no Gastrointestinal: negative for nausea and vomiting.   Patient has not had a bowel movement.  Objective: Vital signs in last 24 hours: Temp:  [96.5 F (35.8 C)-98.5 F (36.9 C)] 97.6 F (36.4 C) (01/17 0754) Pulse Rate:  [62-82] 78 (01/17 0754) Resp:  [17-23] 18 (01/17 0754) BP: (115-152)/(70-85) 136/71 (01/17 0754) SpO2:  [91 %-99 %] 94 % (01/17 0754)  Intake/Output from previous day:  Intake/Output Summary (Last 24 hours) at 09/21/2021 0906 Last data filed at 09/21/2021 0640 Gross per 24 hour  Intake 2658.71 ml  Output 760 ml  Net 1898.71 ml    Intake/Output this shift: No intake/output data recorded.  Labs: No results for input(s): HGB in the last 72 hours. No results for input(s): WBC, RBC, HCT, PLT in the last 72 hours. No results for input(s): NA, K, CL, CO2, BUN, CREATININE, GLUCOSE, CALCIUM in the last 72 hours. No results for input(s): LABPT, INR in the last 72 hours.   EXAM General - Patient is Alert, Appropriate, and Oriented Extremity - Neurovascular intact Dorsiflexion/Plantar flexion intact Compartment soft Dressing/Incision -Postoperative dressing remains in place., Polar Care in place and working. , Hemovac in place. , Following removal of post-op dressing, minimal drainage noted on honeycomb dressing.  Motor Function - intact, moving foot and toes well on exam. Able to perform independent SLR.  Cardiovascular- Regular rate and rhythm, no murmurs/rubs/gallops Respiratory- Lungs clear to auscultation bilaterally Gastrointestinal- soft, nontender, and active bowel sounds   Assessment/Plan: 1 Day Post-Op Procedure(s) (LRB): COMPUTER ASSISTED TOTAL KNEE  ARTHROPLASTY (Left) Principal Problem:   Total knee replacement status  Estimated body mass index is 30.02 kg/m as calculated from the following:   Height as of this encounter: 5\' 6"  (1.676 m).   Weight as of this encounter: 84.4 kg. Advance diet Up with therapy  Possible d/c this PM pending completion of therapy goals.   Post-op dressing removed. , Hemovac removed., Mini compression dressing applied. , and Fresh honeycomb dressing applied.   DVT Prophylaxis - Lovenox, foot pumps, and SCDs Weight-Bearing as tolerated to left leg  Cassell Smiles, PA-C [facility] 09/21/2021, 9:06 AM

## 2021-09-21 NOTE — Progress Notes (Signed)
Physical Therapy Treatment Patient Details Name: Tabitha Gill MRN: [id number] DOB: [birth year] Today's Date: 09/21/2021   History of Present Illness 76 y/o female s/p L TKA on 09/20/21.    PT Comments    Pt received in recliner agreeable to afternoon session. Reports being moderately sedated from pain medication. Initial STS to RW pt did have slight post lean requiring minguard to correct but improve ant trunk lean with cuing on gait belt. Pt progressed ambulation to ~100' with RW with minguard initially progressed to supervision. Difficulty progressing to step through pattern and able to begin initiating but still having difficulty progressing LE's reciprocally. Chair follow performed by husband due to distances to stairs. Pt in recliner post ambulation with PT demo of forwards and side ways stair techniques. Pt performing 2 bouts of stairs with forwards pattern with mingaurd and step to pattern with BUE on R rail with asc and L with desc. Pt safe with performance requiring min VC's/reminders for sequencing LE's. Pt transported back to room in reclinr and PT demo of curb navigation. Pt able to perfrom x2 with minguard and once again, min VC's for LE and RW sequencing. Pt overall very safe with stairs and curb navigation with pt and husband comfortable and confident in d/c'ing home. Pt and spouse finally educated on cart transfer verbalizing understanding and all questions addressed. Pt in recliner with all needs in place. D/c recs remain appropriate.    Recommendations for follow up therapy are one component of a multi-disciplinary discharge planning process, led by the attending physician.  Recommendations may be updated based on patient status, additional functional criteria and insurance authorization.  Follow Up Recommendations  Home health PT     Assistance Recommended at Discharge Intermittent Supervision/Assistance  Patient can return home with the following A little help with walking  and/or transfers;Assistance with cooking/housework;Assist for transportation;A little help with bathing/dressing/bathroom;Help with stairs or ramp for entrance   Equipment Recommendations       Recommendations for Other Services       Precautions / Restrictions Precautions Precautions: Fall Restrictions Weight Bearing Restrictions: Yes LLE Weight Bearing: Weight bearing as tolerated     Mobility  Bed Mobility               General bed mobility comments: NT. In recliner pre and post session Patient Response: Cooperative  Transfers Overall transfer level: Needs assistance Equipment used: Rolling walker (2 wheels) Transfers: Sit to/from Stand Sit to Stand: Min guard           General transfer comment: Initialt STS from recliner with slight post lean but able to attain standing to RW with minguard. Excellent carryover after first standing attempt.    Ambulation/Gait Ambulation/Gait assistance: Supervision Gait Distance (Feet): 100 Feet Assistive device: Rolling walker (2 wheels) Gait Pattern/deviations: Step-to pattern, Decreased stride length, Decreased stance time - left, Decreased weight shift to left, Antalgic, Wide base of support       General Gait Details: Difficulty progressing to step through pattern. Heavy reliance on RW for support.   Stairs Stairs: Yes Stairs assistance: Min guard Stair Management: One rail Right, Step to pattern, Forwards Number of Stairs: 4 General stair comments: Performed twice with seated rest between bouts. Forwards with BUE's on R railing asc/desc. Required min VC's for LE sequencing   Wheelchair Mobility    Modified Rankin (Stroke Patients Only)       Balance Overall balance assessment: Needs assistance Sitting-balance support: No upper extremity supported, Feet supported Sitting  balance-Leahy Scale: Good     Standing balance support: Bilateral upper extremity supported, During functional activity, Reliant on  assistive device for balance Standing balance-Leahy Scale: Fair                              Cognition Arousal/Alertness: Awake/alert Behavior During Therapy: WFL for tasks assessed/performed Overall Cognitive Status: Within Functional Limits for tasks assessed                                          Exercises Other Exercises Other Exercises: ASc/desc curb with RW x2. Performed with CGA and cuing for RW and LE sequencing.    General Comments        Pertinent Vitals/Pain Pain Assessment Pain Assessment: Faces Faces Pain Scale: Hurts little more Pain Location: L knee Pain Descriptors / Indicators: Aching, Discomfort Pain Intervention(s): Limited activity within patient's tolerance, Monitored during session, Premedicated before session, Repositioned, Ice applied    Home Living Family/patient expects to be discharged to:: Private residence Living Arrangements: Spouse/significant other Available Help at Discharge: Family;Available PRN/intermittently Type of Home: House Home Access: Stairs to enter   [employer] of Steps: 1 (no rail) + 1 (w/ railing)   Home Layout: One level Home Equipment: Conservation officer, nature (2 wheels);Cane - single point      Prior Function            PT Goals (current goals can now be found in the care plan section) Acute Rehab PT Goals Patient Stated Goal: to go home PT Goal Formulation: With patient Time For Goal Achievement: 10/04/21 Potential to Achieve Goals: Good Progress towards PT goals: Progressing toward goals    Frequency    BID      PT Plan Current plan remains appropriate    Co-evaluation              AM-PAC PT "6 Clicks" Mobility   Outcome Measure  Help needed turning from your back to your side while in a flat bed without using bedrails?: A Little Help needed moving from lying on your back to sitting on the side of a flat bed without using bedrails?: A Little Help needed moving  to and from a bed to a chair (including a wheelchair)?: A Little Help needed standing up from a chair using your arms (e.g., wheelchair or bedside chair)?: A Little Help needed to walk in hospital room?: A Little Help needed climbing 3-5 steps with a railing? : A Little 6 Click Score: 18    End of Session Equipment Utilized During Treatment: Gait belt Activity Tolerance: Patient tolerated treatment well Patient left: in chair;with family/visitor present Nurse Communication: Mobility status;Precautions PT Visit Diagnosis: Unsteadiness on feet (R26.81);Muscle weakness (generalized) (M62.81);Other abnormalities of gait and mobility (R26.89);Difficulty in walking, not elsewhere classified (R26.2);Pain Pain - Right/Left: Left Pain - part of body: Knee     Time: 8938-1017 PT Time Calculation (min) (ACUTE ONLY): 36 min  Charges:  $Gait Training: 23-37 mins                     Salem Caster. Fairly IV, PT, DPT Physical Therapist- [facility]  09/21/2021, 3:12 PM

## 2021-09-21 NOTE — Progress Notes (Signed)
Patient had done well overnight.  VSS & neurovascular checks adequate.  Dilaudid was given once at beginning of my shift and since patient has been comfortable with her pain and only receiving IV Tylenol.  Patient has had polar care and bone foam in place most of the night.  She has been up to the bathroom with a walker multiple times. No significant changes.  Continue to monitor.

## 2021-09-21 NOTE — Discharge Summary (Signed)
Physician Discharge Summary  Patient ID: Tabitha Gill MRN: [id number] DOB/AGE: Mar 22, 1946 76 y.o.  Admit date: 09/20/2021 Discharge date: 09/21/2021  Admission Diagnoses:  Total knee replacement status [Z96.659]  Surgeries:Procedure(s): Left total knee arthroplasty using computer-assisted navigation   SURGEON:  Marciano Sequin. M.D.   ASSISTANT: Cassell Smiles, PA-C (present and scrubbed throughout the case, critical for assistance with exposure, retraction, instrumentation, and closure)   ANESTHESIA: spinal   ESTIMATED BLOOD LOSS: 50 mL   FLUIDS REPLACED: 700 mL of crystalloid   TOURNIQUET TIME: 89 minutes   DRAINS: 2 medium Hemovac drains   SOFT TISSUE RELEASES: Anterior cruciate ligament, posterior cruciate ligament, deep medial collateral ligament, patellofemoral ligament   IMPLANTS UTILIZED: DePuy Attune size 6N posterior stabilized femoral component (cemented), size 5 rotating platform tibial component (cemented), 35 mm medialized dome patella (cemented), and a 7 mm stabilized rotating platform polyethylene insert.  Discharge Diagnoses: Patient Active Problem List   Diagnosis Date Noted   Anxiety 09/20/2021   Depression 09/20/2021   Hyperlipidemia 09/20/2021   Hypertension 09/20/2021   Menopausal symptom 09/20/2021   Total knee replacement status 09/20/2021   Primary osteoarthritis of left knee 04/16/2021   CKD (chronic kidney disease) stage 3, GFR 30-59 ml/min (HCC) 11/29/2017   GERD (gastroesophageal reflux disease) 10/04/2016   Vitamin D deficiency 10/16/2015    Past Medical History:  Diagnosis Date   Cancer ([facility]) 2005   skin forehead, neck, left ankle   Degenerative arthritis of left knee    Hypertension    Panic attack    Vitamin D deficiency      Transfusion:    Consultants (if any):   Discharged Condition: Improved  Hospital Course: Tabitha Gill is an 76 y.o. female who was admitted 09/20/2021 with a diagnosis of left knee osteoarthritis and  went to the operating room on 09/20/2021 and underwent left total knee arthoplasty. The patient received perioperative antibiotics for prophylaxis (see below). The patient tolerated the procedure well and was transported to PACU in stable condition. After meeting PACU criteria, the patient was subsequently transferred to the Orthopaedics/Rehabilitation unit.   The patient received DVT prophylaxis in the form of early mobilization, Lovenox, TED hose, and SCDs . A sacral pad had been placed and heels were elevated off of the bed with rolled towels in order to protect skin integrity. Foley catheter was discontinued on postoperative day #0. Wound drains were discontinued on postoperative day #1. The surgical incision was healing well without signs of infection.  Physical therapy was initiated postoperatively for transfers, gait training, and strengthening. Occupational therapy was initiated for activities of daily living and evaluation for assisted devices. Rehabilitation goals were reviewed in detail with the patient. The patient made steady progress with physical therapy and physical therapy recommended discharge to Home.   The patient achieved the preliminary goals of this hospitalization and was felt to be medically and orthopaedically appropriate for discharge.  She was given perioperative antibiotics:  Anti-infectives (From admission, onward)    Start     Dose/Rate Route Frequency Ordered Stop   09/20/21 1936  ceFAZolin (ANCEF) 2-4 GM/100ML-% IVPB       Note to Pharmacy: Herby Abraham W: cabinet override      09/20/21 1936 09/21/21 0744   09/20/21 1330  ceFAZolin (ANCEF) IVPB 2g/100 mL premix        2 g 200 mL/hr over 30 Minutes Intravenous Every 6 hours 09/20/21 1044 09/20/21 2030   09/20/21 1307  ceFAZolin (ANCEF) 2-4 GM/100ML-%  IVPB       Note to Pharmacy: Olena Mater F: cabinet override      09/20/21 1307 09/20/21 1319   09/20/21 0648  ceFAZolin (ANCEF) 2-4 GM/100ML-% IVPB       Note  to Pharmacy: Jordan Hawks H: cabinet override      09/20/21 0648 09/20/21 1349   09/20/21 0600  ceFAZolin (ANCEF) IVPB 2g/100 mL premix        2 g 200 mL/hr over 30 Minutes Intravenous On call to O.R. 09/20/21 0112 09/20/21 0745     .  Recent vital signs:  Vitals:   09/21/21 0754 09/21/21 1134  BP: 136/71 (!) 120/59  Pulse: 78 71  Resp: 18 16  Temp: 97.6 F (36.4 C) (!) 97.4 F (36.3 C)  SpO2: 94% 94%    Recent laboratory studies:  No results for input(s): WBC, HGB, HCT, PLT, K, CL, CO2, BUN, CREATININE, GLUCOSE, CALCIUM, LABPT, INR in the last 72 hours.  Diagnostic Studies: DG Knee Left Port  Result Date: 09/20/2021 CLINICAL DATA:  Left total knee replacement EXAM: PORTABLE LEFT KNEE - 1-2 VIEW COMPARISON:  None. FINDINGS: Status post total knee replacement. The prosthetic components appear to be in near anatomic alignment. No perihardware lucency or fracture. Ghost tracts from prior fixation hardware are noted in the femur and tibia. Air within the soft tissues is not unexpected post surgically. Surgical skin staples. A drain is noted. IMPRESSION: Expected postoperative appearance, status post left total knee arthroplasty. Electronically Signed   By: Merilyn Baba M.D.   On: 09/20/2021 11:21    Discharge Medications:   Allergies as of 09/21/2021       Reactions   Cymbalta [duloxetine Hcl] Nausea Only   Penicillins Swelling   IgE = 9 (WNL) on 09/07/2021 Has patient had a PCN reaction causing immediate rash, facial/tongue/throat swelling, SOB or lightheadedness with hypotension: No Has patient had a PCN reaction causing severe rash involving mucus membranes or skin necrosis: No Has patient had a PCN reaction that required hospitalization: No Has patient had a PCN reaction occurring within the last 10 years: No If all of the above answers are "NO", then may proceed with Cephalosporin use.   Sumycin [tetracycline]    Swelling and itching in hands   Valium [diazepam] Other  (See Comments)   Patient felt like she ''was out of her head"        Medication List     STOP taking these medications    aspirin EC 81 MG tablet       TAKE these medications    acetaminophen 500 MG tablet Commonly known as: TYLENOL Take 500-1,000 mg by mouth every 8 (eight) hours as needed for moderate pain. Notes to patient: Take regularly to prevent pain.  500-1000mg  every 8 hours. No more than 4000mg  in 24 hours.    Biofreeze 4 % Gel Generic drug: Menthol (Topical Analgesic) Apply 1 application topically as needed.   Biotin 5000 MCG Caps Take 5,000 mcg by mouth daily.   carbamazepine 200 MG tablet Commonly known as: TEGRETOL Take 100 mg by mouth 2 (two) times daily. Lunch and supper   celecoxib 200 MG capsule Commonly known as: CELEBREX Take 1 capsule (200 mg total) by mouth 2 (two) times daily.   clonazePAM 0.5 MG tablet Commonly known as: KLONOPIN Take 0.25 mg by mouth 2 (two) times daily.   CORTIZONE-10 ECZEMA EX Apply 1 application topically daily as needed.   enoxaparin 40 MG/0.4ML injection Commonly known as:  LOVENOX Inject 0.4 mLs (40 mg total) into the skin daily for 14 days.   eucerin lotion Apply 1 application topically as needed for dry skin.   lamoTRIgine 100 MG tablet Commonly known as: LAMICTAL Take 200 mg by mouth 2 (two) times daily. Lunch and supper   metoprolol succinate 50 MG 24 hr tablet Commonly known as: TOPROL-XL Take 50 mg by mouth daily. Take with or immediately following a meal.   metroNIDAZOLE 0.75 % cream Commonly known as: METROCREAM Apply topically 2 (two) times daily. face   mirtazapine 30 MG disintegrating tablet Commonly known as: REMERON SOL-TAB Take 30 mg by mouth at bedtime.   NON FORMULARY Apply 1 application topically daily in the afternoon. Corner crack cream (each corner of mouth)   oxyCODONE 5 MG immediate release tablet Commonly known as: Oxy IR/ROXICODONE Take 1 tablet (5 mg total) by mouth every 4  (four) hours as needed for severe pain.   Systane Ultra 0.4-0.3 % Soln Generic drug: Polyethyl Glycol-Propyl Glycol Place 1 drop into both eyes daily as needed (dry eyes).   traMADol 50 MG tablet Commonly known as: ULTRAM Take 1 tablet (50 mg total) by mouth every 4 (four) hours as needed for moderate pain.   Vitamin D3 125 MCG (5000 UT) Caps Take 1 capsule by mouth daily in the afternoon.               Durable Medical Equipment  (From admission, onward)           Start     Ordered   09/20/21 1044  DME Walker rolling  Once       Question:  Patient needs a walker to treat with the following condition  Answer:  Total knee replacement status   09/20/21 1043   09/20/21 1044  DME Bedside commode  Once       Question:  Patient needs a bedside commode to treat with the following condition  Answer:  Total knee replacement status   09/20/21 1043            Disposition: Home with home health PT     Follow-up Information     Fausto Skillern, PA-C Follow up on 10/05/2021.   Specialty: Orthopedic Surgery Why: at 9:45am Contact information: [place] [address] [phone]         Dereck Leep, MD Follow up on 11/02/2021.   Specialty: Orthopedic Surgery Why: at 2:30pm Contact information: [place] [address] [place], PA-C 09/21/2021, 2:22 PM

## 2021-09-21 NOTE — TOC Progression Note (Signed)
Transition of Care [facility]) - Progression Note    Patient Details  Name: Tabitha Gill MRN: [id number] Date of Birth: 1946/01/25  Transition of Care [facility]) CM/SW [place], RN Phone Number: 09/21/2021, 10:08 AM  Clinical Narrative:   The patient feels like she needs a new walker due to hers being a wide one, I requested adapt to deliver to the bedside to DC today         Expected Discharge Plan and Services                                                 Social Determinants of Health (SDOH) Interventions    Readmission Risk Interventions No flowsheet data found.

## 2021-09-21 NOTE — Progress Notes (Signed)
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    PT Comments    Pt received supine in bed, agreeable to therapy. Husband enters room at beginning of session. Pt performed 10 reps of each exercise in TKA packet prior to standing; AAROM required for some exercises. STS required multiple attempts and for PT to elevate bed. She ambulated into the hallway restroom, required 2 attempts to stand from toilet. This was followed by 22ft of ambulation with gait pattern beginning to progress to step-through. Pt ended session sitting in recliner. Requests to hold on curb training until the afternoon session. Would benefit from skilled PT to address above deficits and promote optimal return to PLOF.   Recommendations for follow up therapy are one component of a multi-disciplinary discharge planning process, led by the attending physician.  Recommendations may be updated based on patient status, additional functional criteria and insurance authorization.  Follow Up Recommendations  Home health PT     Assistance Recommended at Discharge Intermittent Supervision/Assistance  Patient can return home with the following A little help with walking and/or transfers;Assistance with cooking/housework;Assist for transportation;A little help with bathing/dressing/bathroom;Help with stairs or ramp for entrance   Equipment Recommendations  BSC/3in1 (Pt owns RW.)    Recommendations for Other Services       Precautions / Restrictions Precautions Precautions: Fall Restrictions Weight Bearing Restrictions: Yes LLE Weight Bearing: Weight bearing as tolerated     Mobility  Bed Mobility Overal bed mobility: Needs Assistance Bed Mobility: Supine to Sit     Supine to sit: HOB elevated, Supervision     General bed mobility comments: Performed with increased ease this  session.    Transfers Overall transfer level: Needs assistance Equipment used: Rolling walker (2 wheels) Transfers: Sit to/from Stand Sit to Stand: From elevated surface, Min guard           General transfer comment: Attempted to stand from lowered EOB - unable. PT elevated bed and pt stood with CGA. She required 2 attempts to stand from toilet.    Ambulation/Gait Ambulation/Gait assistance: Min guard Gait Distance (Feet): 60 Feet Assistive device: Rolling walker (2 wheels) Gait Pattern/deviations: Step-to pattern, Decreased stride length, Decreased stance time - left, Decreased weight shift to left, Antalgic, Wide base of support Gait velocity: decreased     General Gait Details: 73ft + 35ft. Mild LLE abduction during L stance phase with pronation of L foot and toe out. Step-to pattern progressing to step-through, still with decreased R step length due to decreased WB through LLE.   Stairs             Wheelchair Mobility    Modified Rankin (Stroke Patients Only)       Balance Overall balance assessment: Needs assistance Sitting-balance support: No upper extremity supported, Feet supported Sitting balance-Leahy Scale: Good     Standing balance support: Bilateral upper extremity supported, During functional activity, Reliant on assistive device for balance Standing balance-Leahy Scale: Fair Standing balance comment: SUP for static standing w/o RW during hand hygiene. Does require RW during ambulation.                            Cognition Arousal/Alertness: Awake/alert Behavior During Therapy: WFL for tasks assessed/performed Overall Cognitive Status: Within Functional Limits for tasks assessed  Exercises Total Joint Exercises Ankle Circles/Pumps: AROM, Left, 10 reps, Supine Quad Sets: AROM, Strengthening, Left, 10 reps, Supine Short Arc Quad: AROM, Strengthening, Left, 10 reps,  Supine Heel Slides: AAROM, Strengthening, Left, 10 reps, Supine Hip ABduction/ADduction: AAROM, Strengthening, Left, 10 reps, Supine Straight Leg Raises: AAROM, Strengthening, Left, 10 reps, Supine Long Arc Quad: AROM, Strengthening, Left, 10 reps, Seated Knee Flexion: AROM, Strengthening, Left, 10 reps, Seated    General Comments        Pertinent Vitals/Pain Pain Assessment Pain Assessment: 0-10 Pain Score: 6  Pain Location: L knee Pain Descriptors / Indicators: Aching, Discomfort Pain Intervention(s): Limited activity within patient's tolerance, Monitored during session, Premedicated before session, Repositioned, Ice applied    Home Living                          Prior Function            PT Goals (current goals can now be found in the care plan section) Acute Rehab PT Goals Patient Stated Goal: to go home PT Goal Formulation: With patient Time For Goal Achievement: 10/04/21 Potential to Achieve Goals: Good    Frequency    BID      PT Plan      Co-evaluation              AM-PAC PT "6 Clicks" Mobility   Outcome Measure  Help needed turning from your back to your side while in a flat bed without using bedrails?: A Little Help needed moving from lying on your back to sitting on the side of a flat bed without using bedrails?: A Little Help needed moving to and from a bed to a chair (including a wheelchair)?: A Little Help needed standing up from a chair using your arms (e.g., wheelchair or bedside chair)?: A Little Help needed to walk in hospital room?: A Little Help needed climbing 3-5 steps with a railing? : A Lot 6 Click Score: 17    End of Session Equipment Utilized During Treatment: Gait belt Activity Tolerance: Patient tolerated treatment well Patient left: in chair;with call bell/phone within reach;with family/visitor present Nurse Communication: Mobility status;Precautions PT Visit Diagnosis: Unsteadiness on feet (R26.81);Muscle  weakness (generalized) (M62.81);Other abnormalities of gait and mobility (R26.89);Difficulty in walking, not elsewhere classified (R26.2);Pain Pain - Right/Left: Left Pain - part of body: Knee     Time: 2725-3664 PT Time Calculation (min) (ACUTE ONLY): 32 min  Charges:  $Gait Training: 8-22 mins $Therapeutic Exercise: 8-22 mins                     Patrina Levering PT, DPT 09/21/21 10:53 AM [phone]

## 2021-09-21 NOTE — Evaluation (Signed)
Occupational Therapy Evaluation Patient Details Name: Tabitha Gill MRN: [id number] DOB: 1945/10/10 Today's Date: 09/21/2021   History of Present Illness 76 y/o female s/p L TKA on 09/20/21.   Clinical Impression   Upon entering the room, pt seated in recliner chair and having just finished with PT. Pt is agreeable to OT evaluation but does report 7/10 pain in L knee. OT educated pt and caregiver on polar care system and how to increase ind with LB self care. OT provided paper handout on these topics as well for pt and husband to refer to at home. Pt demonstrated the ability to simulate threading pants onto L LE. Equipment arrived to room during evaluation, OT educated pt on uses of 3 in1 BSC and recommended placement next to EOB at night to decrease fall risk with frequency at night. Pt performing functional transfers and mobility with supervision - min guard overall and has no further OT concerns at this time. Pt and caregiver agree. All needs within reach. OT to SIGN OFF.      Recommendations for follow up therapy are one component of a multi-disciplinary discharge planning process, led by the attending physician.  Recommendations may be updated based on patient status, additional functional criteria and insurance authorization.   Follow Up Recommendations  No OT follow up    Assistance Recommended at Discharge Intermittent Supervision/Assistance  Patient can return home with the following A little help with walking and/or transfers;A little help with bathing/dressing/bathroom;Help with stairs or ramp for entrance    Functional Status Assessment  Patient has had a recent decline in their functional status and demonstrates the ability to make significant improvements in function in a reasonable and predictable amount of time.  Equipment Recommendations  BSC/3in1;Other (comment) (RW)       Precautions / Restrictions Precautions Precautions: Fall Restrictions Weight Bearing  Restrictions: Yes LLE Weight Bearing: Weight bearing as tolerated      Mobility Bed Mobility Overal bed mobility: Needs Assistance Bed Mobility: Supine to Sit     Supine to sit: HOB elevated, Supervision     General bed mobility comments: Performed with increased ease this session.    Transfers Overall transfer level: Needs assistance Equipment used: Rolling walker (2 wheels) Transfers: Sit to/from Stand Sit to Stand: From elevated surface, Min guard                      ADL either performed or assessed with clinical judgement   ADL Overall ADL's : Needs assistance/impaired                                       General ADL Comments: set up A for grooming and UB self care. Pt able to demonstrate reaching towards LE to simulate dressing L LE to thread clothing on. Pt would need assist with donning sock and shoe.     Vision Patient Visual Report: No change from baseline              Pertinent Vitals/Pain Pain Assessment Pain Assessment: 0-10 Pain Score: 7  Pain Location: L knee Pain Descriptors / Indicators: Aching, Discomfort Pain Intervention(s): Limited activity within patient's tolerance, Monitored during session, Premedicated before session, Repositioned, Ice applied     Hand Dominance Right   Extremity/Trunk Assessment Upper Extremity Assessment Upper Extremity Assessment: Overall WFL for tasks assessed   Lower Extremity Assessment Lower Extremity Assessment: LLE  deficits/detail;Overall WFL for tasks assessed LLE Deficits / Details: L TKA       Communication Communication Communication: No difficulties   Cognition Arousal/Alertness: Awake/alert Behavior During Therapy: WFL for tasks assessed/performed Overall Cognitive Status: Within Functional Limits for tasks assessed                                                  Home Living Family/patient expects to be discharged to:: Private residence Living  Arrangements: Spouse/significant other Available Help at Discharge: Family;Available PRN/intermittently Type of Home: House Home Access: Stairs to enter [employer] of Steps: 1 (no rail) + 1 (w/ railing)   Home Layout: One level     Bathroom Shower/Tub: Tub/shower unit;Sponge bathes at baseline   Bathroom Toilet: Standard Bathroom Accessibility: No   Home Equipment: Conservation officer, nature (2 wheels);Cane - single point          Prior Functioning/Environment Prior Level of Function : Independent/Modified Independent             Mobility Comments: Was Mod I using SPC. Endorses 1 fall in which she tripped over her shoe. ADLs Comments: Takes a bird bath at the sink, is afraid of falling in the shower. Is independent with all ADLs. Company comes to house to clean.        OT Problem List: Decreased strength;Decreased range of motion;Decreased activity tolerance;Impaired balance (sitting and/or standing);Decreased safety awareness;Pain;Decreased knowledge of use of DME or AE      OT Treatment/Interventions: Self-care/ADL training;Manual therapy;Therapeutic exercise;Patient/family education;Balance training;Energy conservation;Therapeutic activities;DME and/or AE instruction    OT Goals(Current goals can be found in the care plan section) Acute Rehab OT Goals Patient Stated Goal: to go home OT Goal Formulation: With patient Time For Goal Achievement: 10/05/21 Potential to Achieve Goals: Good  OT Frequency: Min 2X/week       AM-PAC OT "6 Clicks" Daily Activity     Outcome Measure Help from another person eating meals?: None Help from another person taking care of personal grooming?: None Help from another person toileting, which includes using toliet, bedpan, or urinal?: A Little Help from another person bathing (including washing, rinsing, drying)?: A Little Help from another person to put on and taking off regular upper body clothing?: None Help from another person to  put on and taking off regular lower body clothing?: A Little 6 Click Score: 21   End of Session Equipment Utilized During Treatment: Rolling walker (2 wheels) Nurse Communication: Mobility status;Precautions  Activity Tolerance: Patient tolerated treatment well Patient left: with call bell/phone within reach;in chair;with family/visitor present  OT Visit Diagnosis: Unsteadiness on feet (R26.81);Muscle weakness (generalized) (M62.81);Pain Pain - Right/Left: Left Pain - part of body: Knee                Time: 1005-1040 OT Time Calculation (min): 35 min Charges:  OT General Charges $OT Visit: 1 Visit OT Evaluation $OT Eval Low Complexity: 1 Low OT Treatments $Self Care/Home Management : 23-37 mins  Darleen Crocker, MS, OTR/L , CBIS ascom [phone]  09/21/21, 1:05 PM

## 2021-09-21 NOTE — Progress Notes (Signed)
Discharge instructions given to patient and spouse/verbalizes understanding. IV discontinued.

## 2022-09-21 DIAGNOSIS — M1711 Unilateral primary osteoarthritis, right knee: Secondary | ICD-10-CM | POA: Insufficient documentation

## 2022-11-27 NOTE — Discharge Instructions (Addendum)
Instructions after Total Knee Replacement   James P. Hooten, Jr., M.D.     Dept. of Orthopaedics & Sports Medicine  [facility]  [address]  [place], [place]  [address]  Phone: [phone]   Fax: [phone]    DIET: Drink plenty of non-alcoholic fluids. Resume your normal diet. Include foods high in fiber.  ACTIVITY:  You may use crutches or a walker with weight-bearing as tolerated, unless instructed otherwise. You may be weaned off of the walker or crutches by your Physical Therapist.  Do NOT place pillows under the knee. Anything placed under the knee could limit your ability to straighten the knee.   Continue doing gentle exercises. Exercising will reduce the pain and swelling, increase motion, and prevent muscle weakness.   Please continue to use the TED compression stockings for 6 weeks. You may remove the stockings at night, but should reapply them in the morning. Do not drive or operate any equipment until instructed.  WOUND CARE:  Continue to use the PolarCare or ice packs periodically to reduce pain and swelling. You may bathe or shower after the staples are removed at the first office visit following surgery.  MEDICATIONS: You may resume your regular medications. Please take the pain medication as prescribed on the medication. Do not take pain medication on an empty stomach. You have been given a prescription for a blood thinner (Lovenox or Coumadin). Please take the medication as instructed. (NOTE: After completing a 2 week course of Lovenox, take one Enteric-coated aspirin twice a day. This along with elevation will help reduce the possibility of phlebitis in your operated leg.) Do not drive or drink alcoholic beverages when taking pain medications.  CALL THE OFFICE FOR: Temperature above 101 degrees Excessive bleeding or drainage on the dressing. Excessive swelling, coldness, or paleness of the toes. Persistent nausea and vomiting.  FOLLOW-UP:   You should have an appointment to return to the office in 10-14 days after surgery. Arrangements have been made for continuation of Physical Therapy (either home therapy or outpatient therapy).     [facility] Department Directory         [email or web address]       [email or web address]          Cardiology  Appointments: [place] - [phone] [place] - [phone]  Endocrinology  Appointments: [place] - [phone] [place] - [phone]  Gastroenterology  Appointments: [place] - [phone] [place] - [phone]        General Surgery   Appointments: [place] - [phone]  Internal Medicine/Family Medicine  Appointments: [place] - [phone] [place] - [phone] [place] - [phone]  Metabolic and Weigh Loss Surgery  Appointments: [place] - [phone]        Neurology  Appointments: [place] - [phone] [place] - [phone]  Neurosurgery  Appointments: [place] - [phone]  Obstetrics & Gynecology  Appointments: [place] - [phone] [place] - [phone]        Pediatrics  Appointments: [place] - [phone] [place] - [phone]  Physiatry  Appointments: [place] -[phone]  Physical Therapy  Appointments: [place] - [phone] [place] - [phone]        Podiatry  Appointments: [place] - [phone] [place] - [phone]  Pulmonology  Appointments: [place] - [phone]  Rheumatology  Appointments: [place] - [phone]         Location: [facility]  [address] , [place]  [address]  [place] Location: [facility] [address] [place], [place]  [address]  [place] Location: [facility] [address] [place], [place]  [address]    

## 2022-11-28 ENCOUNTER — Encounter
Admission: RE | Admit: 2022-11-28 | Discharge: 2022-11-28 | Disposition: A | Discharge status: Home / Self Care | Payer: Medicare Other | Source: Ambulatory Visit | Attending: Orthopedic Surgery | Admitting: Orthopedic Surgery

## 2022-11-28 VITALS — BP 136/70 | HR 70 | Resp 12 | Ht 66.5 in | Wt 171.0 lb

## 2022-11-28 DIAGNOSIS — M1711 Unilateral primary osteoarthritis, right knee: Secondary | ICD-10-CM | POA: Diagnosis not present

## 2022-11-28 DIAGNOSIS — Z01818 Encounter for other preprocedural examination: Secondary | ICD-10-CM | POA: Diagnosis not present

## 2022-11-28 DIAGNOSIS — N183 Chronic kidney disease, stage 3 unspecified: Secondary | ICD-10-CM | POA: Diagnosis not present

## 2022-11-28 HISTORY — DX: Hyperlipidemia, unspecified: E78.5

## 2022-11-28 HISTORY — DX: Gastro-esophageal reflux disease without esophagitis: K21.9

## 2022-11-28 HISTORY — DX: Chronic kidney disease, stage 3 unspecified: N18.30

## 2022-11-28 HISTORY — DX: Unspecified osteoarthritis, unspecified site: M19.90

## 2022-11-28 HISTORY — DX: Depression, unspecified: F32.A

## 2022-11-28 LAB — TYPE AND SCREEN
ABO/RH(D): O POS
Antibody Screen: NEGATIVE

## 2022-11-28 LAB — URINALYSIS, ROUTINE W REFLEX MICROSCOPIC
Bilirubin Urine: NEGATIVE
Glucose, UA: NEGATIVE mg/dL
Hgb urine dipstick: NEGATIVE
Ketones, ur: NEGATIVE mg/dL
Nitrite: NEGATIVE
Protein, ur: NEGATIVE mg/dL
Specific Gravity, Urine: 1.015 (ref 1.005–1.030)
pH: 6 (ref 5.0–8.0)

## 2022-11-28 LAB — SURGICAL PCR SCREEN
MRSA, PCR: NEGATIVE
Staphylococcus aureus: NEGATIVE

## 2022-11-28 LAB — C-REACTIVE PROTEIN: CRP: 1.4 mg/dL — ABNORMAL HIGH (ref <1.0)

## 2022-11-28 LAB — SEDIMENTATION RATE: Sed Rate: 6 mm/hr (ref 0–30)

## 2022-11-28 NOTE — Patient Instructions (Addendum)
Your procedure is scheduled on:12-07-22 Wednesday Report to the Registration Desk on the 1st floor of the [facility].Then proceed to the 2nd floor Surgery Desk To find out your arrival time, please call [phone] between 1PM - 3PM on:12-06-22 Tuesday If your arrival time is 6:00 am, do not arrive before that time as the [facility] entrance doors do not open until 6:00 am.  REMEMBER: Instructions that are not followed completely may result in serious medical risk, up to and including death; or upon the discretion of your surgeon and anesthesiologist your surgery may need to be rescheduled.  Do not eat food after midnight the night before surgery.  No gum chewing or hard candies.  You may however, drink CLEAR liquids up to 2 hours before you are scheduled to arrive for your surgery. Do not drink anything within 2 hours of your scheduled arrival time.  Clear liquids include: - water  - apple juice without pulp - gatorade (not RED colors) - black coffee or tea (Do NOT add milk or creamers to the coffee or tea) Do NOT drink anything that is not on this list.  In addition, your doctor has ordered for you to drink the provided:  Ensure Pre-Surgery Clear Carbohydrate Drink Drinking this carbohydrate drink up to two hours before surgery helps to reduce insulin resistance and improve patient outcomes. Please complete drinking 2 hours before scheduled arrival time.  One week prior to surgery: Stop Anti-inflammatories (NSAIDS) such as Advil, Aleve, Ibuprofen, Motrin, Naproxen, Naprosyn and Aspirin based products such as Excedrin, Goody's Powder, BC Powder.You may however, continue to take Tylenol if needed for pain up until the day of surgery.  Stop ANY OVER THE COUNTER supplements/vitamins 7 days prior to surgery 11-29-22 Tuesday(Biotin, Vitamin D3)  Stop your 81 mg Aspirin 7 days prior to surgery-Last dose will be on 11-29-22 Tuesday  TAKE ONLY THESE MEDICATIONS THE MORNING OF SURGERY WITH A  SIP OF WATER: -clonazePAM (KLONOPIN)  -lamoTRIgine (LAMICTAL)  -metoprolol succinate (TOPROL-XL)   No Alcohol for 24 hours before or after surgery.  No Smoking including e-cigarettes for 24 hours before surgery.  No chewable tobacco products for at least 6 hours before surgery.  No nicotine patches on the day of surgery.  Do not use any "recreational" drugs for at least a week (preferably 2 weeks) before your surgery.  Please be advised that the combination of cocaine and anesthesia may have negative outcomes, up to and including death. If you test positive for cocaine, your surgery will be cancelled.  On the morning of surgery brush your teeth with toothpaste and water, you may rinse your mouth with mouthwash if you wish. Do not swallow any toothpaste or mouthwash.  Use CHG Soap as directed on instruction sheet.  Do not wear jewelry, make-up, hairpins, clips or nail polish.  Do not wear lotions, powders, or perfumes.   Do not shave body hair from the neck down 48 hours before surgery.  Contact lenses, hearing aids and dentures may not be worn into surgery.  Do not bring valuables to the hospital. [facility] is not responsible for any missing/lost belongings or valuables.   Notify your doctor if there is any change in your medical condition (cold, fever, infection).  Wear comfortable clothing (specific to your surgery type) to the hospital.  After surgery, you can help prevent lung complications by doing breathing exercises.  Take deep breaths and cough every 1-2 hours. Your doctor may order a device called an Chiropodist  to help you take deep breaths. When coughing or sneezing, hold a pillow firmly against your incision with both hands. This is called "splinting." Doing this helps protect your incision. It also decreases belly discomfort.  If you are being admitted to the hospital overnight, leave your suitcase in the car. After surgery it may be brought to your  room.  In case of increased patient census, it may be necessary for you, the patient, to continue your postoperative care in the Same Day Surgery department.  If you are being discharged the day of surgery, you will not be allowed to drive home. You will need a responsible individual to drive you home and stay with you for 24 hours after surgery.   If you are taking public transportation, you will need to have a responsible individual with you.  Please call the [facility] Dept. at [phone] if you have any questions about these instructions.  Surgery Visitation Policy:  Patients undergoing a surgery or procedure may have two family members or support persons with them as long as the person is not COVID-19 positive or experiencing its symptoms.   Inpatient Visitation:    Visiting hours are 7 a.m. to 8 p.m. Up to four visitors are allowed at one time in a patient room. The visitors may rotate out with other people during the day. One designated support person (adult) may remain overnight.     Preparing for Surgery with CHLORHEXIDINE GLUCONATE (CHG) Soap  Chlorhexidine Gluconate (CHG) Soap  o An antiseptic cleaner that kills germs and bonds with the skin to continue killing germs even after washing  o Used for showering the night before surgery and morning of surgery  Before surgery, you can play an important role by reducing the number of germs on your skin.  CHG (Chlorhexidine gluconate) soap is an antiseptic cleanser which kills germs and bonds with the skin to continue killing germs even after washing.  Please do not use if you have an allergy to CHG or antibacterial soaps. If your skin becomes reddened/irritated stop using the CHG.  1. Shower the NIGHT BEFORE SURGERY and the MORNING OF SURGERY with CHG soap.  2. If you choose to wash your hair, wash your hair first as usual with your normal shampoo.  3. After shampooing, rinse your hair and body thoroughly  to remove the shampoo.  4. Use CHG as you would any other liquid soap. You can apply CHG directly to the skin and wash gently with a scrungie or a clean washcloth.  5. Apply the CHG soap to your body only from the neck down. Do not use on open wounds or open sores. Avoid contact with your eyes, ears, mouth, and genitals (private parts). Wash face and genitals (private parts) with your normal soap.  6. Wash thoroughly, paying special attention to the area where your surgery will be performed.  7. Thoroughly rinse your body with warm water.  8. Do not shower/wash with your normal soap after using and rinsing off the CHG soap.  9. Pat yourself dry with a clean towel.  10. Wear clean pajamas to bed the night before surgery.  12. Place clean sheets on your bed the night of your first shower and do not sleep with pets.  13. Shower again with the CHG soap on the day of surgery prior to arriving at the hospital.  14. Do not apply any deodorants/lotions/powders.  15. Please wear clean clothes to the hospital.  How to Use  an Chiropodist An incentive spirometer is a tool that measures how well you are filling your lungs with each breath. Learning to take long, deep breaths using this tool can help you keep your lungs clear and active. This may help to reverse or lessen your chance of developing breathing (pulmonary) problems, especially infection. You may be asked to use a spirometer: After a surgery. If you have a lung problem or a history of smoking. After a long period of time when you have been unable to move or be active. If the spirometer includes an indicator to show the highest number that you have reached, your health care provider or respiratory therapist will help you set a goal. Keep a log of your progress as told by your health care provider. What are the risks? Breathing too quickly may cause dizziness or cause you to pass out. Take your time so you do not get dizzy or  light-headed. If you are in pain, you may need to take pain medicine before doing incentive spirometry. It is harder to take a deep breath if you are having pain. How to use your incentive spirometer  Sit up on the edge of your bed or on a chair. Hold the incentive spirometer so that it is in an upright position. Before you use the spirometer, breathe out normally. Place the mouthpiece in your mouth. Make sure your lips are closed tightly around it. Breathe in slowly and as deeply as you can through your mouth, causing the piston or the ball to rise toward the top of the chamber. Hold your breath for 3-5 seconds, or for as long as possible. If the spirometer includes a coach indicator, use this to guide you in breathing. Slow down your breathing if the indicator goes above the marked areas. Remove the mouthpiece from your mouth and breathe out normally. The piston or ball will return to the bottom of the chamber. Rest for a few seconds, then repeat the steps 10 or more times. Take your time and take a few normal breaths between deep breaths so that you do not get dizzy or light-headed. Do this every 1-2 hours when you are awake. If the spirometer includes a goal marker to show the highest number you have reached (best effort), use this as a goal to work toward during each repetition. After each set of 10 deep breaths, cough a few times. This will help to make sure that your lungs are clear. If you have an incision on your chest or abdomen from surgery, place a pillow or a rolled-up towel firmly against the incision when you cough. This can help to reduce pain while taking deep breaths and coughing. General tips When you are able to get out of bed: Walk around often. Continue to take deep breaths and cough in order to clear your lungs. Keep using the incentive spirometer until your health care provider says it is okay to stop using it. If you have been in the hospital, you may be told to keep  using the spirometer at home. Contact a health care provider if: You are having difficulty using the spirometer. You have trouble using the spirometer as often as instructed. Your pain medicine is not giving enough relief for you to use the spirometer as told. You have a fever. Get help right away if: You develop shortness of breath. You develop a cough with bloody mucus from the lungs. You have fluid or blood coming from an incision site after  you cough. Summary An incentive spirometer is a tool that can help you learn to take long, deep breaths to keep your lungs clear and active. You may be asked to use a spirometer after a surgery, if you have a lung problem or a history of smoking, or if you have been inactive for a long period of time. Use your incentive spirometer as instructed every 1-2 hours while you are awake. If you have an incision on your chest or abdomen, place a pillow or a rolled-up towel firmly against your incision when you cough. This will help to reduce pain. Get help right away if you have shortness of breath, you cough up bloody mucus, or blood comes from your incision when you cough. This information is not intended to replace advice given to you by your health care provider. Make sure you discuss any questions you have with your health care provider. Document Revised: 11/11/2019 Document Reviewed: 11/11/2019 Elsevier Patient Education  [employer].

## 2022-12-04 NOTE — H&P (Signed)
ORTHOPAEDIC HISTORY & PHYSICAL Tabitha Gill, [place] - 11/29/2022 8:15 AM EDT Formatting of this note is different from the original. Images from the original note were not included. Chief Complaint: Chief Complaint Patient presents with Pre-op Exam Scheduled for Right TKA 12/07/22 with Dr. Lisbeth Ply is a 77 y.o. female who presents today for history and physical for right total knee arthroplasty with Dr. Marry Guan on 12/07/2022. Patient has advanced right knee degenerative arthritis with severe valgus deformity. Patient has had progressive knee pain over the last 7 months. She has tried Tylenol, hinged braces, injections with no improvement. Pain interferes with her quality of life and activities of daily living. She is ambulating now with a cane or walker. Today she is in a wheelchair. Patient's knee is constantly buckling and giving away. She has pain along the lateral joint line that is sharp and debilitating.  Past Medical History: Past Medical History: Diagnosis Date Anxiety Chicken pox Depression depressive syndrome Esophageal reflux w/ stricture GERD (gastroesophageal reflux disease) 10/04/2016 Hyperlipidemia Hypertension Menopausal symptom  Past Surgical History: Past Surgical History: Procedure Laterality Date Left total knee arthroplasty using computer-assisted navigation 09/20/2021 Dr Marry Guan Bunionectomy Right big toe CATARACT SURGERY Bilateral FOOT SURGERY Bilateral HYSTERECTOMY  Past Family History: Family History Problem Relation Age of Onset Stroke Mother Alzheimer's disease Mother  Medications: Current Outpatient Medications Ordered in Epic Medication Sig Dispense Refill acetaminophen (TYLENOL) 500 MG tablet Take 500-1,000 mg by mouth at bedtime as needed for Pain ARIPiprazole (ABILIFY) 2 MG tablet Take 2 mg by mouth at bedtime clonazePAM (KLONOPIN) 0.5 MG tablet Take 0.5 mg by mouth 3 (three) times daily Takes half a tablet in the  morning and half at night lamoTRIgine (LAMICTAL) 100 MG tablet Take 200 mg by mouth 2 (two) times daily metoprolol succinate (TOPROL-XL) 50 MG XL tablet TAKE 1 TABLET BY MOUTH ONCE DAILY. 30 tablet 11 metroNIDAZOLE (METROCREAM) 0.75 % cream Apply topically 2 (two) times daily mirtazapine (REMERON SOLTAB) 30 MG disintegrating tablet Take 30 mg by mouth at bedtime 0  No current Epic-ordered facility-administered medications on file.  Allergies: Allergies Allergen Reactions Bactrim [Sulfamethoxazole-Trimethoprim] Rash Citalopram Nausea, Vomiting and Nausea And Vomiting Diazepam Unknown Patient unsure of allergy Duloxetine Hcl Unknown Patient unsure of allergy Penicillins Swelling IgE = 9 (WNL) on 09/07/2021 -neg for allergy-Pt states son is a dentist and he instructed her no penicillin Tetracycline Rash   Review of Systems: A comprehensive 14 point ROS was performed, reviewed by me today, and the pertinent orthopaedic findings are documented in the HPI.  Exam: BP 124/84  Ht 161.3 cm (5' 3.5")  Wt 77.6 kg (171 lb)  BMI 29.82 kg/m General: Well developed, well nourished, no apparent distress, normal affect, presents in a wheelchair  HEENT: Head normocephalic, atraumatic, PERRL.  Abdomen: Soft, non tender, non distended, Bowel sounds present.  Heart: Examination of the heart reveals regular, rate, and rhythm. There is no murmur noted on ascultation. There is a normal apical pulse.  Lungs: Lungs are clear to auscultation. There is no wheeze, rhonchi, or crackles. There is normal expansion of bilateral chest walls.  Right lower extremity: Examination of the right lower extremity shows mild to moderate swelling. There is no warmth or redness. She is tender along the lateral joint line and nontender along the medial joint line. Significant valgus deformity with correctable deformity. Patient does have good quad tone. Range of motion 0-1 30. Patella tracks well with no significant  crepitation. She is neurovasc intact in right  lower extremity. No swelling or edema in the right lower leg  X-rays of the right knee reviewed by me today from 09/20/2022 show severe valgus deformity with complete loss of joint space and lateral compartment with significant spurring along the medial femoral condyle. Severe patellofemoral arthritic changes with normal tracking of the patella and the trochlear groove.  Impression: Primary osteoarthritis of right knee [M17.11] Primary osteoarthritis of right knee (primary encounter diagnosis)  Plan: 31. 77 year old female with advanced right knee degenerative arthritis with severe valgus deformity. Pain interferes with her quality life and activities daily living. She has failed all forms of conservative treatment consisting of medications, bracing, injections. Risks, benefits complications of a right total knee arthroplasty have been discussed with the patient patient has agreed and consented the procedure with Dr. Marry Guan on 12/07/2022  This note was generated in part with voice recognition software and I apologize for any typographical errors that were not detected and corrected.  Tabitha Gill MPA-C   Electronically signed by Tabitha Gottron, PA at 11/29/2022 8:52 AM EDT

## 2022-12-06 ENCOUNTER — Encounter: Payer: Self-pay | Admitting: Orthopedic Surgery

## 2022-12-07 ENCOUNTER — Other Ambulatory Visit: Payer: Self-pay

## 2022-12-07 ENCOUNTER — Ambulatory Visit: Payer: Medicare Other | Admitting: Urgent Care

## 2022-12-07 ENCOUNTER — Ambulatory Visit: Payer: Medicare Other | Admitting: Anesthesiology

## 2022-12-07 ENCOUNTER — Encounter
Admission: RE | Disposition: A | Discharge status: Home / Self Care | Payer: Self-pay | Source: Home / Self Care | Attending: Orthopedic Surgery

## 2022-12-07 ENCOUNTER — Observation Stay
Admission: RE | Admit: 2022-12-07 | Discharge: 2022-12-08 | Disposition: A | Discharge status: Home / Self Care | Payer: Medicare Other | Attending: Orthopedic Surgery | Admitting: Orthopedic Surgery

## 2022-12-07 ENCOUNTER — Observation Stay: Payer: Medicare Other

## 2022-12-07 DIAGNOSIS — Z96652 Presence of left artificial knee joint: Secondary | ICD-10-CM | POA: Diagnosis not present

## 2022-12-07 DIAGNOSIS — M1711 Unilateral primary osteoarthritis, right knee: Principal | ICD-10-CM | POA: Insufficient documentation

## 2022-12-07 DIAGNOSIS — Z79899 Other long term (current) drug therapy: Secondary | ICD-10-CM | POA: Insufficient documentation

## 2022-12-07 DIAGNOSIS — I129 Hypertensive chronic kidney disease with stage 1 through stage 4 chronic kidney disease, or unspecified chronic kidney disease: Secondary | ICD-10-CM | POA: Insufficient documentation

## 2022-12-07 DIAGNOSIS — N183 Chronic kidney disease, stage 3 unspecified: Secondary | ICD-10-CM | POA: Insufficient documentation

## 2022-12-07 DIAGNOSIS — Z85828 Personal history of other malignant neoplasm of skin: Secondary | ICD-10-CM | POA: Insufficient documentation

## 2022-12-07 DIAGNOSIS — Z96659 Presence of unspecified artificial knee joint: Secondary | ICD-10-CM

## 2022-12-07 HISTORY — PX: KNEE ARTHROPLASTY: SHX992

## 2022-12-07 SURGERY — ARTHROPLASTY, KNEE, TOTAL, USING IMAGELESS COMPUTER-ASSISTED NAVIGATION
Anesthesia: Spinal | Site: Knee | Laterality: Right

## 2022-12-07 MED ORDER — ACETAMINOPHEN 10 MG/ML IV SOLN
1000.0000 mg | Freq: Four times a day (QID) | INTRAVENOUS | Status: DC
  Administered 2022-12-07 – 2022-12-08 (×3): 1000 mg via INTRAVENOUS

## 2022-12-07 MED ORDER — MIDAZOLAM HCL 5 MG/5ML IJ SOLN
INTRAMUSCULAR | Status: DC | PRN
  Administered 2022-12-07: 1 mg via INTRAVENOUS

## 2022-12-07 MED ORDER — HYDROMORPHONE HCL 1 MG/ML IJ SOLN
0.5000 mg | INTRAMUSCULAR | Status: DC | PRN
  Administered 2022-12-07: 0.5 mg via INTRAVENOUS

## 2022-12-07 MED ORDER — CEFAZOLIN SODIUM-DEXTROSE 2-4 GM/100ML-% IV SOLN
2.0000 g | INTRAVENOUS | Status: AC
  Administered 2022-12-07: 2 g via INTRAVENOUS

## 2022-12-07 MED ORDER — MIRTAZAPINE 15 MG PO TBDP
30.0000 mg | ORAL_TABLET | Freq: Every day | ORAL | Status: DC
  Administered 2022-12-07: 30 mg via ORAL
  Filled 2022-12-07: qty 2

## 2022-12-07 MED ORDER — PROPOFOL 500 MG/50ML IV EMUL
INTRAVENOUS | Status: DC | PRN
  Administered 2022-12-07: 150 ug/kg/min via INTRAVENOUS

## 2022-12-07 MED ORDER — OXYCODONE HCL 5 MG PO TABS
5.0000 mg | ORAL_TABLET | ORAL | Status: DC | PRN
  Administered 2022-12-07 – 2022-12-08 (×2): 5 mg via ORAL

## 2022-12-07 MED ORDER — CLONAZEPAM 0.5 MG PO TBDP
0.5000 mg | ORAL_TABLET | Freq: Every day | ORAL | Status: DC
  Administered 2022-12-07: 0.5 mg via ORAL
  Filled 2022-12-07 (×3): qty 1

## 2022-12-07 MED ORDER — BISACODYL 10 MG RE SUPP: 10.0000 mg | Freq: Every day | RECTAL | Status: DC | PRN

## 2022-12-07 MED ORDER — FENTANYL CITRATE (PF) 100 MCG/2ML IJ SOLN: 25.0000 ug | INTRAMUSCULAR | Status: DC | PRN

## 2022-12-07 MED ORDER — GABAPENTIN 300 MG PO CAPS
300.0000 mg | ORAL_CAPSULE | Freq: Once | ORAL | Status: AC
  Administered 2022-12-07: 300 mg via ORAL

## 2022-12-07 MED ORDER — TRANEXAMIC ACID-NACL 1000-0.7 MG/100ML-% IV SOLN
1000.0000 mg | INTRAVENOUS | Status: AC
  Administered 2022-12-07: 1000 mg via INTRAVENOUS

## 2022-12-07 MED ORDER — ACETAMINOPHEN 10 MG/ML IV SOLN
INTRAVENOUS | Status: DC | PRN
  Administered 2022-12-07: 1000 mg via INTRAVENOUS

## 2022-12-07 MED ORDER — HYDROMORPHONE HCL 1 MG/ML IJ SOLN
INTRAMUSCULAR | Status: AC
  Filled 2022-12-07: qty 0.5

## 2022-12-07 MED ORDER — LACTATED RINGERS IV SOLN: INTRAVENOUS | Status: DC

## 2022-12-07 MED ORDER — FLEET ENEMA 7-19 GM/118ML RE ENEM: 1.0000 | ENEMA | Freq: Once | RECTAL | Status: DC | PRN

## 2022-12-07 MED ORDER — PROPOFOL 1000 MG/100ML IV EMUL
INTRAVENOUS | Status: AC
  Filled 2022-12-07: qty 100

## 2022-12-07 MED ORDER — PANTOPRAZOLE SODIUM 40 MG PO TBEC
DELAYED_RELEASE_TABLET | ORAL | Status: AC
  Filled 2022-12-07: qty 1

## 2022-12-07 MED ORDER — METOCLOPRAMIDE HCL 10 MG PO TABS
ORAL_TABLET | ORAL | Status: AC
  Filled 2022-12-07: qty 1

## 2022-12-07 MED ORDER — MAGNESIUM HYDROXIDE 400 MG/5ML PO SUSP: 30.0000 mL | Freq: Every day | ORAL | Status: DC

## 2022-12-07 MED ORDER — PROMETHAZINE HCL 25 MG/ML IJ SOLN: 6.2500 mg | INTRAMUSCULAR | Status: DC | PRN

## 2022-12-07 MED ORDER — TRANEXAMIC ACID-NACL 1000-0.7 MG/100ML-% IV SOLN
INTRAVENOUS | Status: AC
  Filled 2022-12-07: qty 100

## 2022-12-07 MED ORDER — CHLORHEXIDINE GLUCONATE 4 % EX LIQD: 60.0000 mL | Freq: Once | CUTANEOUS | Status: DC

## 2022-12-07 MED ORDER — PHENOL 1.4 % MT LIQD: 1.0000 | OROMUCOSAL | Status: DC | PRN

## 2022-12-07 MED ORDER — DIPHENHYDRAMINE HCL 12.5 MG/5ML PO ELIX
12.5000 mg | ORAL_SOLUTION | ORAL | Status: DC | PRN
  Administered 2022-12-07: 12.5 mg via ORAL

## 2022-12-07 MED ORDER — ORAL CARE MOUTH RINSE: 15.0000 mL | Freq: Once | OROMUCOSAL | Status: AC

## 2022-12-07 MED ORDER — FAMOTIDINE 20 MG PO TABS
20.0000 mg | ORAL_TABLET | Freq: Once | ORAL | Status: AC
  Administered 2022-12-07: 20 mg via ORAL

## 2022-12-07 MED ORDER — ENOXAPARIN SODIUM 30 MG/0.3ML IJ SOSY
30.0000 mg | PREFILLED_SYRINGE | Freq: Two times a day (BID) | INTRAMUSCULAR | Status: DC
  Administered 2022-12-08: 30 mg via SUBCUTANEOUS

## 2022-12-07 MED ORDER — SENNOSIDES-DOCUSATE SODIUM 8.6-50 MG PO TABS
1.0000 | ORAL_TABLET | Freq: Two times a day (BID) | ORAL | Status: DC
  Administered 2022-12-07 – 2022-12-08 (×2): 1 via ORAL

## 2022-12-07 MED ORDER — PANTOPRAZOLE SODIUM 40 MG PO TBEC
40.0000 mg | DELAYED_RELEASE_TABLET | Freq: Two times a day (BID) | ORAL | Status: DC
  Administered 2022-12-07 – 2022-12-08 (×2): 40 mg via ORAL

## 2022-12-07 MED ORDER — CHLORHEXIDINE GLUCONATE 0.12 % MT SOLN
15.0000 mL | Freq: Once | OROMUCOSAL | Status: AC
  Administered 2022-12-07: 15 mL via OROMUCOSAL

## 2022-12-07 MED ORDER — DIPHENHYDRAMINE HCL 12.5 MG/5ML PO ELIX
ORAL_SOLUTION | ORAL | Status: AC
  Filled 2022-12-07: qty 5

## 2022-12-07 MED ORDER — DROPERIDOL 2.5 MG/ML IJ SOLN: 0.6250 mg | Freq: Once | INTRAMUSCULAR | Status: DC | PRN

## 2022-12-07 MED ORDER — OXYCODONE HCL 5 MG PO TABS
ORAL_TABLET | ORAL | Status: AC
  Filled 2022-12-07: qty 1

## 2022-12-07 MED ORDER — PHENYLEPHRINE HCL (PRESSORS) 10 MG/ML IV SOLN
INTRAVENOUS | Status: DC | PRN
  Administered 2022-12-07: 80 ug via INTRAVENOUS

## 2022-12-07 MED ORDER — MENTHOL 3 MG MT LOZG: 1.0000 | LOZENGE | OROMUCOSAL | Status: DC | PRN

## 2022-12-07 MED ORDER — TRANEXAMIC ACID-NACL 1000-0.7 MG/100ML-% IV SOLN
1000.0000 mg | Freq: Once | INTRAVENOUS | Status: AC
  Administered 2022-12-07: 1000 mg via INTRAVENOUS

## 2022-12-07 MED ORDER — DEXAMETHASONE SODIUM PHOSPHATE 10 MG/ML IJ SOLN
8.0000 mg | Freq: Once | INTRAMUSCULAR | Status: AC
  Administered 2022-12-07: 8 mg via INTRAVENOUS

## 2022-12-07 MED ORDER — ACETAMINOPHEN 325 MG PO TABS: 325.0000 mg | ORAL_TABLET | Freq: Four times a day (QID) | ORAL | Status: DC | PRN

## 2022-12-07 MED ORDER — CHLORHEXIDINE GLUCONATE 0.12 % MT SOLN
OROMUCOSAL | Status: AC
  Filled 2022-12-07: qty 15

## 2022-12-07 MED ORDER — DEXAMETHASONE SODIUM PHOSPHATE 10 MG/ML IJ SOLN
INTRAMUSCULAR | Status: AC
  Filled 2022-12-07: qty 1

## 2022-12-07 MED ORDER — ACETAMINOPHEN 10 MG/ML IV SOLN
INTRAVENOUS | Status: AC
  Filled 2022-12-07: qty 100

## 2022-12-07 MED ORDER — ENSURE PRE-SURGERY PO LIQD
296.0000 mL | Freq: Once | ORAL | Status: AC
  Administered 2022-12-07: 296 mL via ORAL

## 2022-12-07 MED ORDER — FAMOTIDINE 20 MG PO TABS
ORAL_TABLET | ORAL | Status: AC
  Filled 2022-12-07: qty 1

## 2022-12-07 MED ORDER — POLYVINYL ALCOHOL 1.4 % OP SOLN: 1.0000 [drp] | OPHTHALMIC | Status: DC | PRN

## 2022-12-07 MED ORDER — TRAMADOL HCL 50 MG PO TABS
50.0000 mg | ORAL_TABLET | ORAL | Status: DC | PRN
  Administered 2022-12-08: 50 mg via ORAL

## 2022-12-07 MED ORDER — OXYCODONE HCL 5 MG/5ML PO SOLN: 5.0000 mg | Freq: Once | ORAL | Status: DC | PRN

## 2022-12-07 MED ORDER — BUPIVACAINE LIPOSOME 1.3 % IJ SUSP
INTRAMUSCULAR | Status: AC
  Filled 2022-12-07: qty 20

## 2022-12-07 MED ORDER — METOCLOPRAMIDE HCL 10 MG PO TABS
10.0000 mg | ORAL_TABLET | Freq: Three times a day (TID) | ORAL | Status: DC
  Administered 2022-12-07 – 2022-12-08 (×2): 10 mg via ORAL

## 2022-12-07 MED ORDER — CELECOXIB 200 MG PO CAPS
ORAL_CAPSULE | ORAL | Status: AC
  Filled 2022-12-07: qty 2

## 2022-12-07 MED ORDER — PHENYLEPHRINE HCL-NACL 20-0.9 MG/250ML-% IV SOLN
INTRAVENOUS | Status: DC | PRN
  Administered 2022-12-07: 40 ug/min via INTRAVENOUS

## 2022-12-07 MED ORDER — ONDANSETRON HCL 4 MG PO TABS: 4.0000 mg | ORAL_TABLET | Freq: Four times a day (QID) | ORAL | Status: DC | PRN

## 2022-12-07 MED ORDER — ONDANSETRON HCL 4 MG/2ML IJ SOLN: 4.0000 mg | Freq: Four times a day (QID) | INTRAMUSCULAR | Status: DC | PRN

## 2022-12-07 MED ORDER — OXYCODONE HCL 5 MG PO TABS: 5.0000 mg | ORAL_TABLET | Freq: Once | ORAL | Status: DC | PRN

## 2022-12-07 MED ORDER — CELECOXIB 200 MG PO CAPS: 400.0000 mg | ORAL_CAPSULE | Freq: Once | ORAL | Status: DC

## 2022-12-07 MED ORDER — CEFAZOLIN SODIUM-DEXTROSE 2-4 GM/100ML-% IV SOLN
2.0000 g | Freq: Four times a day (QID) | INTRAVENOUS | Status: AC
  Administered 2022-12-07 – 2022-12-08 (×2): 2 g via INTRAVENOUS

## 2022-12-07 MED ORDER — SODIUM CHLORIDE 0.9 % IR SOLN
Status: DC | PRN
  Administered 2022-12-07: 3000 mL

## 2022-12-07 MED ORDER — SODIUM CHLORIDE 0.9 % IV SOLN: INTRAVENOUS | Status: DC

## 2022-12-07 MED ORDER — BUPIVACAINE HCL (PF) 0.5 % IJ SOLN
INTRAMUSCULAR | Status: DC | PRN
  Administered 2022-12-07: 2.8 mL

## 2022-12-07 MED ORDER — CLONAZEPAM 0.25 MG PO TBDP
0.2500 mg | ORAL_TABLET | Freq: Every day | ORAL | Status: DC
  Administered 2022-12-08: 0.25 mg via ORAL
  Filled 2022-12-07 (×3): qty 1

## 2022-12-07 MED ORDER — GABAPENTIN 300 MG PO CAPS
ORAL_CAPSULE | ORAL | Status: AC
  Filled 2022-12-07: qty 1

## 2022-12-07 MED ORDER — CELECOXIB 200 MG PO CAPS
ORAL_CAPSULE | ORAL | Status: AC
  Filled 2022-12-07: qty 1

## 2022-12-07 MED ORDER — METOPROLOL SUCCINATE ER 25 MG PO TB24
50.0000 mg | ORAL_TABLET | ORAL | Status: DC
  Administered 2022-12-08: 50 mg via ORAL
  Filled 2022-12-07: qty 2

## 2022-12-07 MED ORDER — CELECOXIB 200 MG PO CAPS
200.0000 mg | ORAL_CAPSULE | Freq: Two times a day (BID) | ORAL | Status: DC
  Administered 2022-12-07: 200 mg via ORAL

## 2022-12-07 MED ORDER — ACETAMINOPHEN 10 MG/ML IV SOLN: 1000.0000 mg | Freq: Once | INTRAVENOUS | Status: DC | PRN

## 2022-12-07 MED ORDER — FENTANYL CITRATE (PF) 100 MCG/2ML IJ SOLN
INTRAMUSCULAR | Status: AC
  Filled 2022-12-07: qty 2

## 2022-12-07 MED ORDER — OXYCODONE HCL 5 MG PO TABS
10.0000 mg | ORAL_TABLET | ORAL | Status: DC | PRN
  Administered 2022-12-07: 5 mg via ORAL

## 2022-12-07 MED ORDER — ARIPIPRAZOLE 2 MG PO TABS
2.0000 mg | ORAL_TABLET | Freq: Every evening | ORAL | Status: DC
  Administered 2022-12-07: 2 mg via ORAL
  Filled 2022-12-07: qty 1

## 2022-12-07 MED ORDER — MIDAZOLAM HCL 2 MG/2ML IJ SOLN
INTRAMUSCULAR | Status: AC
  Filled 2022-12-07: qty 2

## 2022-12-07 MED ORDER — CEFAZOLIN SODIUM-DEXTROSE 2-4 GM/100ML-% IV SOLN
INTRAVENOUS | Status: AC
  Filled 2022-12-07: qty 100

## 2022-12-07 MED ORDER — FENTANYL CITRATE (PF) 100 MCG/2ML IJ SOLN
INTRAMUSCULAR | Status: DC | PRN
  Administered 2022-12-07: 50 ug via INTRAVENOUS

## 2022-12-07 MED ORDER — SODIUM CHLORIDE (PF) 0.9 % IJ SOLN
INTRAMUSCULAR | Status: DC | PRN
  Administered 2022-12-07: 120 mL via INTRAMUSCULAR

## 2022-12-07 MED ORDER — IRRISEPT - 450ML BOTTLE WITH 0.05% CHG IN STERILE WATER, USP 99.95% OPTIME
TOPICAL | Status: DC | PRN
  Administered 2022-12-07: 450 mL

## 2022-12-07 MED ORDER — ALUM & MAG HYDROXIDE-SIMETH 200-200-20 MG/5ML PO SUSP: 30.0000 mL | ORAL | Status: DC | PRN

## 2022-12-07 MED ORDER — SENNOSIDES-DOCUSATE SODIUM 8.6-50 MG PO TABS
ORAL_TABLET | ORAL | Status: AC
  Filled 2022-12-07: qty 1

## 2022-12-07 SURGICAL SUPPLY — 76 items
ATTUNE MED DOME PAT 38 KNEE (Knees) IMPLANT
ATTUNE PSFEM RTSZ6 NARCEM KNEE (Femur) IMPLANT
ATTUNE PSRP INSR SZ6 8 KNEE (Insert) IMPLANT
BASE TIBIAL ROT PLAT SZ 5 KNEE (Knees) IMPLANT
BATTERY INSTRU NAVIGATION (MISCELLANEOUS) ×4 IMPLANT
BLADE SAW 70X12.5 (BLADE) ×1 IMPLANT
BLADE SAW 90X13X1.19 OSCILLAT (BLADE) ×1 IMPLANT
BLADE SAW 90X25X1.19 OSCILLAT (BLADE) ×1 IMPLANT
BONE CEMENT GENTAMICIN (Cement) ×2 IMPLANT
BSPLAT TIB 5 CMNT ROT PLAT STR (Knees) ×1 IMPLANT
BTRY SRG DRVR LF (MISCELLANEOUS) ×4
CEMENT BONE GENTAMICIN 40 (Cement) IMPLANT
COOLER POLAR GLACIER W/PUMP (MISCELLANEOUS) ×1 IMPLANT
CUFF TOURN SGL QUICK 24 (TOURNIQUET CUFF)
CUFF TOURN SGL QUICK 34 (TOURNIQUET CUFF)
CUFF TRNQT CYL 24X4X16.5-23 (TOURNIQUET CUFF) IMPLANT
CUFF TRNQT CYL 34X4.125X (TOURNIQUET CUFF) IMPLANT
DRAPE 3/4 80X56 (DRAPES) ×1 IMPLANT
DRAPE INCISE IOBAN 66X45 STRL (DRAPES) IMPLANT
DRSG AQUACEL AG ADV 3.5X 4 (GAUZE/BANDAGES/DRESSINGS) IMPLANT
DRSG AQUACEL AG ADV 3.5X14 (GAUZE/BANDAGES/DRESSINGS) IMPLANT
DRSG DERMACEA NONADH 3X8 (GAUZE/BANDAGES/DRESSINGS) ×1 IMPLANT
DRSG MEPILEX SACRM 8.7X9.8 (GAUZE/BANDAGES/DRESSINGS) ×1 IMPLANT
DRSG OPSITE POSTOP 4X14 (GAUZE/BANDAGES/DRESSINGS) ×1 IMPLANT
DRSG TEGADERM 4X4.75 (GAUZE/BANDAGES/DRESSINGS) ×1 IMPLANT
DURAPREP 26ML APPLICATOR (WOUND CARE) ×2 IMPLANT
ELECT CAUTERY BLADE 6.4 (BLADE) ×1 IMPLANT
ELECT REM PT RETURN 9FT ADLT (ELECTROSURGICAL) ×1
ELECTRODE REM PT RTRN 9FT ADLT (ELECTROSURGICAL) ×1 IMPLANT
EX-PIN ORTHOLOCK NAV 4X150 (PIN) ×2 IMPLANT
GLOVE BIOGEL M STRL SZ7.5 (GLOVE) ×2 IMPLANT
GLOVE SRG 8 PF TXTR STRL LF DI (GLOVE) ×1 IMPLANT
GLOVE SURG UNDER POLY LF SZ8 (GLOVE) ×1
GOWN STRL REUS W/ TWL LRG LVL3 (GOWN DISPOSABLE) ×1 IMPLANT
GOWN STRL REUS W/TWL LRG LVL3 (GOWN DISPOSABLE) ×1
GOWN TOGA ZIPPER T7+ PEEL AWAY (MISCELLANEOUS) ×1 IMPLANT
HANDLE YANKAUER SUCT OPEN TIP (MISCELLANEOUS) ×1 IMPLANT
HEMOVAC 400CC 10FR (MISCELLANEOUS) ×1 IMPLANT
HOLDER FOLEY CATH W/STRAP (MISCELLANEOUS) ×1 IMPLANT
HOOD PEEL AWAY T7 (MISCELLANEOUS) IMPLANT
IV NS IRRIG 3000ML ARTHROMATIC (IV SOLUTION) ×1 IMPLANT
JET LAVAGE IRRISEPT WOUND (IRRIGATION / IRRIGATOR) ×1
KIT TURNOVER KIT A (KITS) ×1 IMPLANT
KNIFE SCULPS 14X20 (INSTRUMENTS) ×1 IMPLANT
LAVAGE JET IRRISEPT WOUND (IRRIGATION / IRRIGATOR) IMPLANT
MANIFOLD NEPTUNE II (INSTRUMENTS) ×2 IMPLANT
NDL SPNL 20GX3.5 QUINCKE YW (NEEDLE) ×2 IMPLANT
NEEDLE SPNL 20GX3.5 QUINCKE YW (NEEDLE) ×2 IMPLANT
NS IRRIG 500ML POUR BTL (IV SOLUTION) ×1 IMPLANT
PACK TOTAL KNEE (MISCELLANEOUS) ×1 IMPLANT
PAD ABD DERMACEA PRESS 5X9 (GAUZE/BANDAGES/DRESSINGS) ×2 IMPLANT
PAD WRAPON POLAR KNEE (MISCELLANEOUS) ×1 IMPLANT
PIN DRILL FIX HALF THREAD (BIT) ×2 IMPLANT
PIN FIXATION 1/8DIA X 3INL (PIN) ×1 IMPLANT
PULSAVAC PLUS IRRIG FAN TIP (DISPOSABLE) ×1
SOL PREP PVP 2OZ (MISCELLANEOUS) ×1
SOLUTION IRRIG SURGIPHOR (IV SOLUTION) ×1 IMPLANT
SOLUTION PREP PVP 2OZ (MISCELLANEOUS) ×1 IMPLANT
SPONGE DRAIN TRACH 4X4 STRL 2S (GAUZE/BANDAGES/DRESSINGS) ×1 IMPLANT
STAPLER SKIN PROX 35W (STAPLE) ×1 IMPLANT
STOCKINETTE IMPERV 14X48 (MISCELLANEOUS) ×1 IMPLANT
STRAP TIBIA SHORT (MISCELLANEOUS) ×1 IMPLANT
SUCTION FRAZIER HANDLE 10FR (MISCELLANEOUS) ×1
SUCTION TUBE FRAZIER 10FR DISP (MISCELLANEOUS) ×1 IMPLANT
SUT VIC AB 0 CT1 36 (SUTURE) ×1 IMPLANT
SUT VIC AB 1 CT1 36 (SUTURE) ×2 IMPLANT
SUT VIC AB 2-0 CT2 27 (SUTURE) ×1 IMPLANT
SYR 30ML LL (SYRINGE) ×2 IMPLANT
TIBIAL BASE ROT PLAT SZ 5 KNEE (Knees) ×1 IMPLANT
TIP FAN IRRIG PULSAVAC PLUS (DISPOSABLE) ×1 IMPLANT
TOWEL OR 17X26 4PK STRL BLUE (TOWEL DISPOSABLE) IMPLANT
TOWER CARTRIDGE SMART MIX (DISPOSABLE) ×1 IMPLANT
TRAP FLUID SMOKE EVACUATOR (MISCELLANEOUS) ×1 IMPLANT
TRAY FOLEY MTR SLVR 16FR STAT (SET/KITS/TRAYS/PACK) ×1 IMPLANT
WATER STERILE IRR 1000ML POUR (IV SOLUTION) ×1 IMPLANT
WRAPON POLAR PAD KNEE (MISCELLANEOUS) ×1

## 2022-12-07 NOTE — Transfer of Care (Signed)
Immediate Anesthesia Transfer of Care Note  Patient: Tabitha Gill [facility]  Procedure(s) Performed: COMPUTER ASSISTED TOTAL KNEE ARTHROPLASTY (Right: Knee)  Patient Location: PACU  Anesthesia Type:Spinal  Level of Consciousness: drowsy  Airway & Oxygen Therapy: Patient Spontanous Breathing  Post-op Assessment: Report given to RN and Post -op Vital signs reviewed and stable  Post vital signs: Reviewed and stable  Last Vitals:  Vitals Value Taken Time  BP 111/65 12/07/22 1715  Temp    Pulse 77 12/07/22 1715  Resp 22 12/07/22 1715  SpO2 92 % 12/07/22 1715    Last Pain:  Vitals:   12/07/22 1035  TempSrc: Oral         Complications: No notable events documented.

## 2022-12-07 NOTE — Anesthesia Postprocedure Evaluation (Signed)
Anesthesia Post Note  Patient: Tabitha Gill [facility]  Procedure(s) Performed: COMPUTER ASSISTED TOTAL KNEE ARTHROPLASTY (Right: Knee)  Patient location during evaluation: PACU Anesthesia Type: Spinal Level of consciousness: awake and alert Pain management: pain level controlled Vital Signs Assessment: post-procedure vital signs reviewed and stable Respiratory status: spontaneous breathing, nonlabored ventilation and respiratory function stable Cardiovascular status: blood pressure returned to baseline and stable Postop Assessment: no apparent nausea or vomiting Anesthetic complications: no   No notable events documented.   Last Vitals:  Vitals:   12/07/22 2000 12/07/22 2045  BP: 136/71 (!) 121/58  Pulse: 79 82  Resp:  16  Temp: (!) 36.3 C (!) 36.2 C  SpO2: 93% 95%    Last Pain:  Vitals:   12/07/22 2045  TempSrc:   PainSc: 0-No pain                 [place] Ouch

## 2022-12-07 NOTE — Anesthesia Procedure Notes (Signed)
Spinal  Patient location during procedure: OR Start time: 12/07/2022 1:38 PM End time: 12/07/2022 1:42 PM Reason for block: surgical anesthesia Staffing Performed: resident/CRNA  Resident/CRNA: Debe Coder, CRNA Performed by: Debe Coder, CRNA Authorized by: Molli Barrows, MD   Preanesthetic Checklist Completed: patient identified, IV checked, site marked, risks and benefits discussed, surgical consent, monitors and equipment checked, pre-op evaluation and timeout performed Spinal Block Patient position: sitting Prep: DuraPrep Patient monitoring: heart rate, cardiac monitor, continuous pulse ox and blood pressure Approach: midline Location: L3-4 Injection technique: single-shot Needle Needle type: Sprotte  Needle gauge: 24 G Needle length: 9 cm Assessment Sensory level: T4 Events: CSF return

## 2022-12-07 NOTE — Interval H&P Note (Signed)
History and Physical Interval Note:  12/07/2022 12:57 PM  Tabitha Gill  has presented today for surgery, with the diagnosis of PRIMARY OSTEOARTHRITIS OF RIGHT KNEE..  The various methods of treatment have been discussed with the patient and family. After consideration of risks, benefits and other options for treatment, the patient has consented to  Procedure(s): COMPUTER ASSISTED TOTAL KNEE ARTHROPLASTY - RNFA (Right) as a surgical intervention.  The patient's history has been reviewed, patient examined, no change in status, stable for surgery.  I have reviewed the patient's chart and labs.  Questions were answered to the patient's satisfaction.     [place]

## 2022-12-07 NOTE — Op Note (Signed)
OPERATIVE NOTE  DATE OF SURGERY:  12/07/2022  PATIENT NAME:  Tabitha Gill [id number]   DOB: 1945-10-19  MRN: [id number]  PRE-OPERATIVE DIAGNOSIS: Degenerative arthrosis of the right knee, primary  POST-OPERATIVE DIAGNOSIS:  Same  PROCEDURE:  Right total knee arthroplasty using computer-assisted navigation  SURGEON:  Marciano Sequin. M.D.  ANESTHESIA: spinal  ESTIMATED BLOOD LOSS: 50 mL  FLUIDS REPLACED: 1400 mL of crystalloid  TOURNIQUET TIME: 85 minutes  DRAINS: 2 medium Hemovac drains  SOFT TISSUE RELEASES: Anterior cruciate ligament, posterior cruciate ligament, deep medial collateral ligament, patellofemoral ligament  IMPLANTS UTILIZED: DePuy Attune size 6N posterior stabilized femoral component (cemented), size 5 rotating platform tibial component (cemented), 38 mm medialized dome patella (cemented), and an 8 mm stabilized rotating platform polyethylene insert.  INDICATIONS FOR SURGERY: Tabitha Gill is a 77 y.o. year old female with a long history of progressive knee pain. X-rays demonstrated severe degenerative changes in tricompartmental fashion. The patient had not seen any significant improvement despite conservative nonsurgical intervention. After discussion of the risks and benefits of surgical intervention, the patient expressed understanding of the risks benefits and agree with plans for total knee arthroplasty.   The risks, benefits, and alternatives were discussed at length including but not limited to the risks of infection, bleeding, nerve injury, stiffness, blood clots, the need for revision surgery, cardiopulmonary complications, among others, and they were willing to proceed.  PROCEDURE IN DETAIL: The patient was brought into the operating room and, after adequate spinal anesthesia was achieved, a tourniquet was placed on the patient's upper thigh. The patient's knee and leg were cleaned and prepped with alcohol and DuraPrep and draped in the usual sterile fashion. A  "timeout" was performed as per usual protocol. The lower extremity was exsanguinated using an Esmarch, and the tourniquet was inflated to 300 mmHg. An anterior longitudinal incision was made followed by a standard mid vastus approach. The deep fibers of the medial collateral ligament were elevated in a subperiosteal fashion off of the medial flare of the tibia so as to maintain a continuous soft tissue sleeve. The patella was subluxed laterally and the patellofemoral ligament was incised. Inspection of the knee demonstrated severe degenerative changes with full-thickness loss of articular cartilage. Osteophytes were debrided using a rongeur. Anterior and posterior cruciate ligaments were excised. Two 4.0 mm Schanz pins were inserted in the femur and into the tibia for attachment of the array of trackers used for computer-assisted navigation. Hip center was identified using a circumduction technique. Distal landmarks were mapped using the computer. The distal femur and proximal tibia were mapped using the computer. The distal femoral cutting guide was positioned using computer-assisted navigation so as to achieve a 5 distal valgus cut. The femur was sized and it was felt that a size 6N femoral component was appropriate. A size 6 femoral cutting guide was positioned and the anterior cut was performed and verified using the computer. This was followed by completion of the posterior and chamfer cuts. Femoral cutting guide for the central box was then positioned in the center box cut was performed.  Attention was then directed to the proximal tibia. Medial and lateral menisci were excised. The extramedullary tibial cutting guide was positioned using computer-assisted navigation so as to achieve a 0 varus-valgus alignment and 3 posterior slope. The cut was performed and verified using the computer. The proximal tibia was sized and it was felt that a size 5 tibial tray was appropriate. Tibial and femoral trials were  inserted followed  by insertion of an 8 mm polyethylene insert. This allowed for excellent mediolateral soft tissue balancing both in flexion and in full extension. Finally, the patella was cut and prepared so as to accommodate a 38 mm medialized dome patella. A patella trial was placed and the knee was placed through a range of motion with excellent patellar tracking appreciated. The femoral trial was removed after debridement of posterior osteophytes. The central post-hole for the tibial component was reamed followed by insertion of a keel punch. Tibial trials were then removed. Cut surfaces of bone were irrigated with copious amounts of normal saline using pulsatile lavage and then suctioned dry. Polymethylmethacrylate cement with gentamicin was prepared in the usual fashion using a vacuum mixer. Cement was applied to the cut surface of the proximal tibia as well as along the undersurface of a size 5 rotating platform tibial component. Tibial component was positioned and impacted into place. Excess cement was removed using [employer] fast streamer. Cement was then applied to the cut surfaces of the femur as well as along the posterior flanges of the size 6N femoral component. The femoral component was positioned and impacted into place. Excess cement was removed using [employer] fast streamer. An 8 mm polyethylene trial was inserted and the knee was brought into full extension with steady axial compression applied. Finally, cement was applied to the backside of a 38 mm medialized dome patella and the patellar component was positioned and patellar clamp applied. Excess cement was removed using [employer] fast streamer. After adequate curing of the cement, the tourniquet was deflated after a total tourniquet time of 85 minutes. Hemostasis was achieved using electrocautery. The knee was irrigated with copious amounts of normal saline using pulsatile lavage followed by 450 ml of Surgiphor and then suctioned dry. 20 mL of 1.3% Exparel and 60 mL  of 0.25% Marcaine in 40 mL of normal saline was injected along the posterior capsule, medial and lateral gutters, and along the arthrotomy site. An 8 mm stabilized rotating platform polyethylene insert was inserted and the knee was placed through a range of motion with excellent mediolateral soft tissue balancing appreciated and excellent patellar tracking noted. 2 medium drains were placed in the wound bed and brought out through separate stab incisions. The medial parapatellar portion of the incision was reapproximated using interrupted sutures of #1 Vicryl. Subcutaneous tissue was approximated in layers using first #0 Vicryl followed #2-0 Vicryl. The skin was approximated with skin staples. A sterile dressing was applied.  The patient tolerated the procedure well and was transported to the recovery room in stable condition.    Heath Tesler P. Holley Bouche., M.D.

## 2022-12-07 NOTE — Anesthesia Preprocedure Evaluation (Signed)
Anesthesia Evaluation  Patient identified by MRN, date of birth, ID band Patient awake    Reviewed: Allergy & Precautions, H&P , NPO status , Patient's Chart, lab work & pertinent test results, reviewed documented beta blocker date and time   Airway Mallampati: II   Neck ROM: full    Dental  (+) Poor Dentition   Pulmonary neg pulmonary ROS   Pulmonary exam normal        Cardiovascular Exercise Tolerance: Good hypertension, On Medications negative cardio ROS Normal cardiovascular exam Rhythm:regular Rate:Normal     Neuro/Psych   Anxiety Depression    negative neurological ROS  negative psych ROS   GI/Hepatic Neg liver ROS,GERD  Medicated,,  Endo/Other  negative endocrine ROS    Renal/GU Renal disease  negative genitourinary   Musculoskeletal   Abdominal   Peds  Hematology negative hematology ROS (+)   Anesthesia Other Findings Past Medical History: No date: CKD (chronic kidney disease), stage III No date: Degenerative arthritis of left knee No date: Depression No date: GERD (gastroesophageal reflux disease) No date: HLD (hyperlipidemia) No date: Hypertension No date: OA (osteoarthritis) No date: Panic attack 2005: Skin cancer     Comment:  forehead, neck, left ankle No date: Vitamin D deficiency Past Surgical History: 1970: ABDOMINAL HYSTERECTOMY 2009: ANKLE SURGERY; Right     Comment:  titanium screw 03/20/2018: BREAST BIOPSY; Right     Comment:   Stereo attempted- too superficial to do 2009: BUNIONECTOMY; Right 2011: BUNIONECTOMY; Left 2013: CATARACT EXTRACTION, BILATERAL; Bilateral 09/20/2021: KNEE ARTHROPLASTY; Left     Comment:  Procedure: COMPUTER ASSISTED TOTAL KNEE ARTHROPLASTY;                Surgeon: Dereck Leep, MD;  Location: [facility] ORS;                Service: Orthopedics;  Laterality: Left; 1950: TONSILLECTOMY BMI    Body Mass Index: 27.18 kg/m      Reproductive/Obstetrics negative OB ROS                             Anesthesia Physical Anesthesia Plan  ASA: 3  Anesthesia Plan: Spinal   Post-op Pain Management:    Induction:   PONV Risk Score and Plan: 3  Airway Management Planned:   Additional Equipment:   Intra-op Plan:   Post-operative Plan:   Informed Consent: I have reviewed the patients History and Physical, chart, labs and discussed the procedure including the risks, benefits and alternatives for the proposed anesthesia with the patient or authorized representative who has indicated his/her understanding and acceptance.     Dental Advisory Given  Plan Discussed with: CRNA  Anesthesia Plan Comments:        Anesthesia Quick Evaluation

## 2022-12-08 ENCOUNTER — Encounter: Payer: Self-pay | Admitting: Orthopedic Surgery

## 2022-12-08 DIAGNOSIS — M1711 Unilateral primary osteoarthritis, right knee: Secondary | ICD-10-CM | POA: Diagnosis not present

## 2022-12-08 MED ORDER — OXYCODONE HCL 5 MG PO TABS: 5.0000 mg | ORAL_TABLET | ORAL | 0 refills | Status: AC | PRN

## 2022-12-08 MED ORDER — SENNOSIDES-DOCUSATE SODIUM 8.6-50 MG PO TABS
ORAL_TABLET | ORAL | Status: AC
  Filled 2022-12-08: qty 1

## 2022-12-08 MED ORDER — CEFAZOLIN SODIUM-DEXTROSE 2-4 GM/100ML-% IV SOLN
INTRAVENOUS | Status: AC
  Filled 2022-12-08: qty 100

## 2022-12-08 MED ORDER — METOCLOPRAMIDE HCL 10 MG PO TABS
ORAL_TABLET | ORAL | Status: AC
  Filled 2022-12-08: qty 1

## 2022-12-08 MED ORDER — TRAMADOL HCL 50 MG PO TABS: 50.0000 mg | ORAL_TABLET | ORAL | 0 refills | Status: AC | PRN

## 2022-12-08 MED ORDER — ENOXAPARIN SODIUM 40 MG/0.4ML IJ SOSY: 40.0000 mg | PREFILLED_SYRINGE | INTRAMUSCULAR | 0 refills | Status: AC

## 2022-12-08 MED ORDER — PANTOPRAZOLE SODIUM 40 MG PO TBEC
DELAYED_RELEASE_TABLET | ORAL | Status: AC
  Filled 2022-12-08: qty 1

## 2022-12-08 MED ORDER — MAGNESIUM HYDROXIDE 400 MG/5ML PO SUSP
ORAL | Status: AC
  Filled 2022-12-08: qty 30

## 2022-12-08 MED ORDER — ENOXAPARIN SODIUM 30 MG/0.3ML IJ SOSY
PREFILLED_SYRINGE | INTRAMUSCULAR | Status: AC
  Filled 2022-12-08: qty 0.3

## 2022-12-08 MED ORDER — ACETAMINOPHEN 10 MG/ML IV SOLN
INTRAVENOUS | Status: AC
  Filled 2022-12-08: qty 100

## 2022-12-08 MED ORDER — SENNOSIDES-DOCUSATE SODIUM 8.6-50 MG PO TABS: 1.0000 | ORAL_TABLET | Freq: Two times a day (BID) | ORAL | 0 refills | Status: AC

## 2022-12-08 MED ORDER — TRAMADOL HCL 50 MG PO TABS
ORAL_TABLET | ORAL | Status: AC
  Filled 2022-12-08: qty 1

## 2022-12-08 MED ORDER — OXYCODONE HCL 5 MG PO TABS
ORAL_TABLET | ORAL | Status: AC
  Filled 2022-12-08: qty 1

## 2022-12-08 NOTE — TOC Transition Note (Signed)
Transition of Care [facility]) - CM/SW Discharge Note   Patient Details  Name: Tabitha Gill MRN: [id number] Date of Birth: 30-May-1946  [facility] of [facility]) CM/SW Contact:  Beverly Sessions, RN Phone Number: 12/08/2022, 10:07 AM   Clinical Narrative:      Patient was set up with [place] home health preop Notified [place] with [place] that patient to DC today Per PT patient already has RW and BSC in the home       Patient Goals and CMS Choice      Discharge Placement                         Discharge Plan and Services Additional resources added to the After Visit Summary for                                       Social Determinants of Health (SDOH) Interventions SDOH Screenings   Food Insecurity: No Food Insecurity (12/07/2022)  Housing: Low Risk  (12/07/2022)  Transportation Needs: No Transportation Needs (12/07/2022)  Utilities: Not At Risk (12/07/2022)  Tobacco Use: Low Risk  (12/08/2022)     Readmission Risk Interventions     No data to display

## 2022-12-08 NOTE — Evaluation (Signed)
Physical Therapy Evaluation Patient Details Name: Tabitha Gill MRN: [id number] DOB: November 05, 1945 Today's Date: 12/08/2022  History of Present Illness  Pt is a 77 y.o. female s/p R TKA secondary to degenerative arthrosis 12/07/22.  PMH includes anxiety, htn, L TKA 09/20/21, B foot sx, CKD, panic attack.  Clinical Impression  Prior to surgery, pt was modified independent ambulating household distances with [facility]; lives with her husband in 1 level home with steps to enter with R railing; no recent falls reported.  R knee pain 2-3/10 at rest beginning of session and 8/10 at rest end of session (nurse present and planning to give pt pain medication).  Able to perform R LE SLR independently (so no KI utilized) and R knee AROM 0-92 degrees.  Pt performed supine and sitting TKA HEP and pt demonstrating and verbalizing appropriate understanding (pt issued written HEP).  Currently pt is SBA supine to sitting edge of bed; CGA progressing to SBA with transfers using RW; CGA progressing to SBA ambulating 80 feet with RW use (pt reports typically only walking household distances and declined to walk further); and CGA to navigate 3 steps with B UE support on railing.  Reviewed home safety, fall prevention, and safe car transfers: pt and pt's husband verbalizing appropriate understanding.  Pt would currently benefit from skilled PT to address noted impairments and functional limitations (see below for any additional details).  Upon hospital discharge, pt would benefit from ongoing therapy (pt planning for HHPT).  Pt and pt's husband report no questions/concerns for home discharge today.  Pt appears safe to discharge home today with support/assist (pt's husband reports someone will be available 24/7 initially): MD, PA, nurse, and TOC notified/updated on pt's status.    Recommendations for follow up therapy are one component of a multi-disciplinary discharge planning process, led by the attending physician.  Recommendations may be  updated based on patient status, additional functional criteria and insurance authorization.        Assistance Recommended at Discharge Frequent or constant Supervision/Assistance  Patient can return home with the following  A little help with walking and/or transfers;A little help with bathing/dressing/bathroom;Assistance with cooking/housework;Assist for transportation;Help with stairs or ramp for entrance    Equipment Recommendations Rolling walker (2 wheels);BSC/3in1 (pt reports having RW and BSC at home already (from last surgery))  Recommendations for Other Services  OT consult    Functional Status Assessment Patient has had a recent decline in their functional status and demonstrates the ability to make significant improvements in function in a reasonable and predictable amount of time.     Precautions / Restrictions Precautions Precautions: Fall;Knee Precaution Booklet Issued: Yes (comment) Precaution Comments: R KI if unable to SLR Restrictions Weight Bearing Restrictions: Yes RLE Weight Bearing: Weight bearing as tolerated      Mobility  Bed Mobility Overal bed mobility: Needs Assistance Bed Mobility: Supine to Sit     Supine to sit: Supervision     General bed mobility comments: bed flat; mild increased effort to perform on own    Transfers Overall transfer level: Needs assistance Equipment used: Rolling walker (2 wheels) Transfers: Sit to/from Stand Sit to Stand: Min guard, Supervision           General transfer comment: x3 trials standing from bed; x3 trials standing from recliner; x1 trial standing from toilet (with use of grab bar); initial vc's for UE/LE placement and overall technique; steady with RW use    Ambulation/Gait Ambulation/Gait assistance: Min guard, Supervision Gait Distance (Feet):  (  20 feet (to bathroom) then 80 feet) Assistive device: Rolling walker (2 wheels)   Gait velocity: decreased     General Gait Details: antalgic;  step to gait pattern; steady with RW use  Stairs Stairs: Yes Stairs assistance: Min guard Stair Management: One rail Right, Step to pattern, Sideways Number of Stairs: 3 General stair comments: initial vc's/demo for technique; increase effort to perform but pt steady with B UE support on railing (pt reporting feeling good about steps and declined further stairs training)  Wheelchair Mobility    Modified Rankin (Stroke Patients Only)       Balance Overall balance assessment: Needs assistance Sitting-balance support: No upper extremity supported, Feet supported Sitting balance-Leahy Scale: Normal Sitting balance - Comments: steady sitting reaching outside BOS   Standing balance support: No upper extremity supported Standing balance-Leahy Scale: Good Standing balance comment: steady standing washing hands at sink                             Pertinent Vitals/Pain Pain Assessment Pain Assessment: 0-10 Pain Score: 8  Pain Location: R knee Pain Descriptors / Indicators: Aching, Sore, Tender Pain Intervention(s): Limited activity within patient's tolerance, Monitored during session, Premedicated before session, Repositioned, Patient requesting pain meds-RN notified, Other (comment) (polar care applied) Vitals stable and WFL throughout treatment session.    Home Living Family/patient expects to be discharged to:: Private residence Living Arrangements: Spouse/significant other Available Help at Discharge: Family;Friend(s);Available 24 hours/day Type of Home: House Home Access: Stairs to enter Entrance Stairs-Rails: Right Entrance Stairs-Number of Steps: 1 plus (two) 1/2 steps (both with R railing)   Home Layout: One level Home Equipment: Conservation officer, nature (2 wheels);Rollator (4 wheels);Cane - single point;BSC/3in1;Hand held shower head;Shower seat      Prior Function Prior Level of Function : Independent/Modified Independent             Mobility Comments:  Modified independent using SPC.  No recent falls reported. ADLs Comments: Takes sponge baths.     Hand Dominance        Extremity/Trunk Assessment   Upper Extremity Assessment Upper Extremity Assessment: Defer to OT evaluation;Overall WFL for tasks assessed    Lower Extremity Assessment Lower Extremity Assessment: RLE deficits/detail RLE Deficits / Details: at least 3/5 AROM hip flexion and ankle DF/PF; able to perform R LE SLR independently RLE: Unable to fully assess due to pain    Cervical / Trunk Assessment Cervical / Trunk Assessment: Normal  Communication   Communication: No difficulties  Cognition Arousal/Alertness: Awake/alert Behavior During Therapy: WFL for tasks assessed/performed Overall Cognitive Status: Within Functional Limits for tasks assessed                                          General Comments General comments (skin integrity, edema, etc.): mild drainage noted R distal knee dressing prior to activity; increased drainage noted distal R knee dressing during/post activity (appearing boggy)--nurse notified and nurse reported she contacted MD    Exercises Total Joint Exercises Ankle Circles/Pumps: AROM, Strengthening, Both, 10 reps, Supine Quad Sets: AROM, Strengthening, Right, 10 reps, Supine Short Arc Quad: AROM, Strengthening, Right, 10 reps, Supine Heel Slides: AROM, Strengthening, Right, 10 reps, Supine Hip ABduction/ADduction: AROM, Strengthening, Right, 10 reps, Supine Straight Leg Raises: AROM, Strengthening, Right, 10 reps, Supine Long Arc Quad: AROM, Strengthening, Right, 10 reps, Seated Knee  Flexion: AROM, Strengthening, Right, 10 reps, Seated Goniometric ROM: R knee AROM 0-92 degrees   Assessment/Plan    PT Assessment Patient needs continued PT services  PT Problem List Decreased strength;Decreased range of motion;Decreased activity tolerance;Decreased balance;Decreased mobility;Decreased knowledge of use of DME;Decreased  knowledge of precautions;Decreased skin integrity;Pain       PT Treatment Interventions DME instruction;Gait training;Stair training;Functional mobility training;Therapeutic activities;Therapeutic exercise;Balance training;Patient/family education    PT Goals (Current goals can be found in the Care Plan section)  Acute Rehab PT Goals Patient Stated Goal: to improve walking and go home PT Goal Formulation: With patient Time For Goal Achievement: 12/22/22 Potential to Achieve Goals: Good    Frequency BID     Co-evaluation               AM-PAC PT "6 Clicks" Mobility  Outcome Measure Help needed turning from your back to your side while in a flat bed without using bedrails?: None Help needed moving from lying on your back to sitting on the side of a flat bed without using bedrails?: A Little Help needed moving to and from a bed to a chair (including a wheelchair)?: A Little Help needed standing up from a chair using your arms (e.g., wheelchair or bedside chair)?: A Little Help needed to walk in hospital room?: A Little Help needed climbing 3-5 steps with a railing? : A Little 6 Click Score: 19    End of Session Equipment Utilized During Treatment: Gait belt Activity Tolerance: Patient tolerated treatment well Patient left: in chair;with call bell/phone within reach;with nursing/sitter in room;with family/visitor present;with SCD's reapplied;Other (comment) (polar care in place; B heels floating via towel rolls) Nurse Communication: Mobility status;Precautions;Patient requests pain meds;Weight bearing status PT Visit Diagnosis: Other abnormalities of gait and mobility (R26.89);Muscle weakness (generalized) (M62.81);Pain Pain - Right/Left: Right Pain - part of body: Knee    Time: 0849-1000 PT Time Calculation (min) (ACUTE ONLY): 71 min   Charges:   PT Evaluation $PT Eval Low Complexity: 1 Low PT Treatments $Gait Training: 8-22 mins $Therapeutic Exercise: 8-22  mins $Therapeutic Activity: 8-22 mins        Leitha Bleak, PT 12/08/22, 10:37 AM

## 2022-12-08 NOTE — Progress Notes (Signed)
  Subjective: 1 Day Post-Op Procedure(s) (LRB): COMPUTER ASSISTED TOTAL KNEE ARTHROPLASTY (Right) Patient reports pain as mild.   Patient is well, and has had no acute complaints or problems Plan is to go Home after hospital stay. Negative for chest pain and shortness of breath Fever: no Gastrointestinal: Negative for nausea and vomiting  Objective: Vital signs in last 24 hours: Temp:  [97.1 F (36.2 C)-97.9 F (36.6 C)] 97.9 F (36.6 C) (04/04 0500) Pulse Rate:  [67-82] 69 (04/04 0500) Resp:  [14-22] 16 (04/04 0500) BP: (111-140)/(51-83) 111/51 (04/04 0500) SpO2:  [92 %-99 %] 94 % (04/04 0500) Weight:  [77.6 kg] 77.6 kg (04/03 1035)  Intake/Output from previous day:  Intake/Output Summary (Last 24 hours) at 12/08/2022 0712 Last data filed at 12/08/2022 0400 Gross per 24 hour  Intake 3516.67 ml  Output 1150 ml  Net 2366.67 ml    Intake/Output this shift: No intake/output data recorded.  Labs: No results for input(s): "HGB" in the last 72 hours. No results for input(s): "WBC", "RBC", "HCT", "PLT" in the last 72 hours. No results for input(s): "NA", "K", "CL", "CO2", "BUN", "CREATININE", "GLUCOSE", "CALCIUM" in the last 72 hours. No results for input(s): "LABPT", "INR" in the last 72 hours.   EXAM General - Patient is Alert and Oriented Extremity - Neurovascular intact Sensation intact distally Dorsiflexion/Plantar flexion intact Compartment soft Dressing/Incision - clean, dry, scant drainage distally on the dressing.  The surgical dressings were removed.  The Hemovac tubing was removed with no complication.  The Hemovac tubing was intact on removal. Motor Function - intact, moving foot and toes well on exam.  Able to straight leg raise independently.  Past Medical History:  Diagnosis Date   CKD (chronic kidney disease), stage III    Degenerative arthritis of left knee    Depression    GERD (gastroesophageal reflux disease)    HLD (hyperlipidemia)    Hypertension     OA (osteoarthritis)    Panic attack    Skin cancer 2005   forehead, neck, left ankle   Vitamin D deficiency     Assessment/Plan: 1 Day Post-Op Procedure(s) (LRB): COMPUTER ASSISTED TOTAL KNEE ARTHROPLASTY (Right) Principal Problem:   Total knee replacement status  Estimated body mass index is 27.18 kg/m as calculated from the following:   Height as of this encounter: 5' 6.5" (1.689 m).   Weight as of this encounter: 77.6 kg. Advance diet Up with therapy D/C IV fluids Discharge home with home health  DVT Prophylaxis - Lovenox, Foot Pumps, and TED hose Weight-Bearing as tolerated to right leg  Reche Dixon, PA-C Orthopaedic Surgery 12/08/2022, 7:12 AM

## 2022-12-08 NOTE — Evaluation (Signed)
Occupational Therapy Evaluation Patient Details Name: Tabitha Gill MRN: [id number] DOB: 06/29/1946 Today's Date: 12/08/2022   History of Present Illness Pt is a 77 y.o. female s/p R TKA secondary to degenerative arthrosis 12/07/22.  PMH includes anxiety, htn, L TKA 09/20/21, B foot sx, CKD, panic attack.   Clinical Impression   Patient received for OT evaluation. See flowsheet below for details of function. Education provided on TKA ADL/IADL safety in the home (see flowsheet) for pt and husband at bedside. All needs met. D/c OT services at this time.       Recommendations for follow up therapy are one component of a multi-disciplinary discharge planning process, led by the attending physician.  Recommendations may be updated based on patient status, additional functional criteria and insurance authorization.   Assistance Recommended at Discharge Intermittent Supervision/Assistance  Patient can return home with the following Assistance with cooking/housework;Direct supervision/assist for medications management;Help with stairs or ramp for entrance;Assist for transportation    Functional Status Assessment  Patient has had a recent decline in their functional status and demonstrates the ability to make significant improvements in function in a reasonable and predictable amount of time.  Equipment Recommendations  None recommended by OT    Recommendations for Other Services       Precautions / Restrictions Precautions Precautions: Fall;Knee Precaution Booklet Issued: Yes (comment) Precaution Comments: R KI if unable to SLR Restrictions Weight Bearing Restrictions: Yes RLE Weight Bearing: Weight bearing as tolerated      Mobility Bed Mobility               General bed mobility comments: pt received and left in recliner.    Transfers                   General transfer comment: pt declines transfers at this time 2/2 having completed them with PT.      Balance                                            ADL either performed or assessed with clinical judgement   ADL Overall ADL's : Modified independent                                       General ADL Comments: BIL hand grip and UE AROM WNL. Pt able to demonstrate leaning forward in chair to nearly touch toes in long sitting from recliner; states she gets dressed seated in her bathroom. Education provided on use of BSC next to bed at night and over the commode during the day to promote mobility in daytime; pt and husband agreeable. Reviewed LB dressing technique (pt already dressed; states she was independent). Reviewed home safety with RW, especially with need to carry items such as coffee or food; husband with difficult carrying liquids due to tremor; discussed use of travel mug and bag on front of RW for pt to carry own coffee to her recliner where she likes to drink it while still keeping hands free for RW. pt states she has already been up to bathroom here with no concerns about transfers; pt denies need for further mobility practice or ADL practice at this time. Pt states her housecleaner will have incresed hours in the coming weeks to help more with IADLs.  Vision         Perception     Praxis      Pertinent Vitals/Pain Pain Assessment Pain Assessment: 0-10 Pain Score:  (unrated, low) Pain Location: R knee Pain Descriptors / Indicators: Aching, Sore, Tender Pain Intervention(s): Limited activity within patient's tolerance, Monitored during session, Ice applied     Hand Dominance     Extremity/Trunk Assessment Upper Extremity Assessment Upper Extremity Assessment: Overall WFL for tasks assessed   Lower Extremity Assessment Lower Extremity Assessment: Defer to PT evaluation RLE Deficits / Details: at least 3/5 AROM hip flexion and ankle DF/PF; able to perform R LE SLR independently RLE: Unable to fully assess due to pain   Cervical / Trunk  Assessment Cervical / Trunk Assessment: Normal   Communication Communication Communication: No difficulties   Cognition Arousal/Alertness: Awake/alert Behavior During Therapy: WFL for tasks assessed/performed Overall Cognitive Status: Within Functional Limits for tasks assessed                                 General Comments: Husband did correct pt a few times as to home equipment, etc. Overall oriented. No concerns about cognition for daily activities.     General Comments  Pt declining mobility today; comfortable sitting up in recliner at this time.    Exercises     Shoulder Instructions      Home Living Family/patient expects to be discharged to:: Private residence Living Arrangements: Spouse/significant other Available Help at Discharge: Family;Friend(s);Available 24 hours/day Type of Home: House Home Access: Stairs to enter [employer] of Steps: 1 plus (two) 1/2 steps (both with R railing) Entrance Stairs-Rails: Right Home Layout: One level     Bathroom Shower/Tub: Teacher, early years/pre: Handicapped height Bathroom Accessibility: Yes How Accessible: Accessible via walker Home Equipment: [place] (2 wheels);Rollator (4 wheels);Cane - single point;BSC/3in1;Hand held shower head;Shower seat   Additional Comments: son is coming tonight to help through the weekend as needed (he lives in [place]). Husband is "in and out", but can assist with most IADLs and ADLs as needed; husband does have a tremor, so issues with carrying liquids.      Prior Functioning/Environment Prior Level of Function : Independent/Modified Independent             Mobility Comments: Modified independent using SPC.  No recent falls reported. ADLs Comments: States she is (I). Pt states she likes watching TV, reading magazines, planting flowers, and going to [place] with her husband.        OT Problem List:        OT Treatment/Interventions:       OT Goals(Current goals can be found in the care plan section) Acute Rehab OT Goals Patient Stated Goal: Go home. OT Goal Formulation: With patient/family Potential to Achieve Goals:  (n/a; d/c)  OT Frequency:      Co-evaluation              AM-PAC OT "6 Clicks" Daily Activity     Outcome Measure Help from another person eating meals?: None Help from another person taking care of personal grooming?: None Help from another person toileting, which includes using toliet, bedpan, or urinal?: None Help from another person bathing (including washing, rinsing, drying)?: None Help from another person to put on and taking off regular upper body clothing?: None Help from another person to put on and taking off regular lower body clothing?: None 6  Click Score: 24   End of Session Nurse Communication: Mobility status  Activity Tolerance: Patient tolerated treatment well Patient left: in chair;with family/visitor present  OT Visit Diagnosis: Other (comment) (s/p TKA)                Time: 1011-1023 OT Time Calculation (min): 12 min Charges:  OT General Charges $OT Visit: 1 Visit OT Evaluation $OT Eval Low Complexity: 1 Low OT Treatments $Self Care/Home Management : 8-22 mins  Waymon Amato, MS, OTR/L  Tabitha Gill 12/08/2022, 10:38 AM

## 2022-12-08 NOTE — Discharge Summary (Signed)
Physician Discharge Summary  Subjective: 1 Day Post-Op Procedure(s) (LRB): COMPUTER ASSISTED TOTAL KNEE ARTHROPLASTY (Right) Patient reports pain as mild.   Patient seen in rounds with Dr. Marry Guan. Patient is well, and has had no acute complaints or problems Patient is ready to go home with home health physical therapy  Physician Discharge Summary  Patient ID: Tabitha Gill MRN: [id number] DOB/AGE: February 24, 1946 77 y.o.  Admit date: 12/07/2022 Discharge date: 12/08/2022  Admission Diagnoses:  Discharge Diagnoses:  Principal Problem:   Total knee replacement status   Discharged Condition: good  Hospital Course: Patient is postop day 1 from a right total knee arthroplasty.  She is doing well since surgery.  Her pain is manageable.  Her vitals have remained stable.  Her surgical bandage was removed today including her Hemovac tubing was removed.  She will do physical therapy this morning and is planning to go home after physical therapy goals are met.  Treatments: surgery:   Right total knee arthroplasty using computer-assisted navigation   SURGEON:  Marciano Sequin. M.D.   ANESTHESIA: spinal   ESTIMATED BLOOD LOSS: 50 mL   FLUIDS REPLACED: 1400 mL of crystalloid   TOURNIQUET TIME: 85 minutes   DRAINS: 2 medium Hemovac drains   SOFT TISSUE RELEASES: Anterior cruciate ligament, posterior cruciate ligament, deep medial collateral ligament, patellofemoral ligament   IMPLANTS UTILIZED: DePuy Attune size 6N posterior stabilized femoral component (cemented), size 5 rotating platform tibial component (cemented), 38 mm medialized dome patella (cemented), and an 8 mm stabilized rotating platform polyethylene insert.  Discharge Exam: Blood pressure (!) 111/51, pulse 69, temperature 97.9 F (36.6 C), resp. rate 16, height 5' 6.5" (1.689 m), weight 77.6 kg, SpO2 94 %.   Disposition: Discharge disposition: 01-Home or Self Care        Allergies as of 12/08/2022       Reactions    Celexa [citalopram] Nausea And Vomiting   Cymbalta [duloxetine Hcl] Nausea Only   Penicillins Swelling   IgE = 9 (WNL) on 09/07/2021 Has patient had a PCN reaction causing immediate rash, facial/tongue/throat swelling, SOB or lightheadedness with hypotension: No Has patient had a PCN reaction causing severe rash involving mucus membranes or skin necrosis: No Has patient had a PCN reaction that required hospitalization: No Has patient had a PCN reaction occurring within the last 10 years: No If all of the above answers are "NO", then may proceed with Cephalosporin use.   Sumycin [tetracycline]    Swelling and itching in hands   Valium [diazepam] Other (See Comments)   Patient felt like she ''was out of her head"   Bactrim [sulfamethoxazole-trimethoprim] Rash   Celebrex [celecoxib] Diarrhea, Itching   Itching to hands "on the inside"        Medication List     STOP taking these medications    aspirin EC 81 MG tablet       TAKE these medications    acetaminophen 500 MG tablet Commonly known as: TYLENOL Take 500-1,000 mg by mouth every 8 (eight) hours as needed for moderate pain.   ARIPiprazole 2 MG tablet Commonly known as: ABILIFY Take 2 mg by mouth every evening.   Biofreeze 4 % Gel Generic drug: Menthol (Topical Analgesic) Apply 1 application topically as needed.   Biotin 5000 MCG Caps Take 5,000 mcg by mouth daily.   clindamycin 300 MG capsule Commonly known as: CLEOCIN Take 600 mg by mouth See admin instructions. Take 1 hour prior to dental appointments   clonazePAM 0.5  MG tablet Commonly known as: KLONOPIN Take 0.25-0.5 mg by mouth 2 (two) times daily. 0.25 mg in the morning 0.5 mg at night   enoxaparin 40 MG/0.4ML injection Commonly known as: LOVENOX Inject 0.4 mLs (40 mg total) into the skin daily for 14 days.   enoxaparin 40 MG/0.4ML injection Commonly known as: LOVENOX Inject 0.4 mLs (40 mg total) into the skin daily for 14 days.   lamoTRIgine  100 MG tablet Commonly known as: LAMICTAL Take 200 mg by mouth 2 (two) times daily.   metoprolol succinate 50 MG 24 hr tablet Commonly known as: TOPROL-XL Take 50 mg by mouth every morning. Take with or immediately following a meal.   metroNIDAZOLE 0.75 % cream Commonly known as: METROCREAM Apply 1 Application topically every morning. face   mirtazapine 30 MG disintegrating tablet Commonly known as: REMERON SOL-TAB Take 30 mg by mouth at bedtime.   NON FORMULARY Apply 1 application topically daily in the afternoon. Corner crack cream (each corner of mouth)   oxyCODONE 5 MG immediate release tablet Commonly known as: Oxy IR/ROXICODONE Take 1 tablet (5 mg total) by mouth every 4 (four) hours as needed for moderate pain (pain score 4-6).   REFRESH OP Apply 1 drop to eye as needed.   senna-docusate 8.6-50 MG tablet Commonly known as: Senokot-S Take 1 tablet by mouth 2 (two) times daily.   sodium chloride 0.65 % Soln nasal spray Commonly known as: OCEAN Place 1 spray into both nostrils as needed for congestion.   traMADol 50 MG tablet Commonly known as: ULTRAM Take 1-2 tablets (50-100 mg total) by mouth every 4 (four) hours as needed for moderate pain.   Vitamin D3 50 MCG (2000 UT) Tabs Generic drug: Cholecalciferol Take 1 tablet by mouth daily at 6 (six) AM.               Durable Medical Equipment  (From admission, onward)           Start     Ordered   12/07/22 1755  DME Walker rolling  Once       Question:  Patient needs a walker to treat with the following condition  Answer:  Total knee replacement status   12/07/22 1754   12/07/22 1755  DME Bedside commode  Once       Comments: Patient is not able to walk the distance required to go the bathroom, or he/she is unable to safely negotiate stairs required to access the bathroom.  A 3in1 BSC will alleviate this problem  Question:  Patient needs a bedside commode to treat with the following condition  Answer:   Total knee replacement status   12/07/22 1754            Follow-up Information     Watt Climes, PA Follow up on 12/21/2022.   Specialty: Physician Assistant Why: at 10:15am Contact information: [place] [address] [phone]         Dereck Leep, MD Follow up on 01/19/2023.   Specialty: Orthopedic Surgery Why: at 9:45am Contact information: [place] [address] [phone]                 Signed: Prescott Parma, Marirose Deveney 12/08/2022, 7:17 AM   Objective: Vital signs in last 24 hours: Temp:  [97.1 F (36.2 C)-97.9 F (36.6 C)] 97.9 F (36.6 C) (04/04 0500) Pulse Rate:  [67-82] 69 (04/04 0500) Resp:  [14-22] 16 (04/04 0500) BP: (  111-140)/(51-83) 111/51 (04/04 0500) SpO2:  [92 %-99 %] 94 % (04/04 0500) Weight:  [77.6 kg] 77.6 kg (04/03 1035)  Intake/Output from previous day:  Intake/Output Summary (Last 24 hours) at 12/08/2022 0717 Last data filed at 12/08/2022 0400 Gross per 24 hour  Intake 3516.67 ml  Output 1150 ml  Net 2366.67 ml    Intake/Output this shift: No intake/output data recorded.  Labs: No results for input(s): "HGB" in the last 72 hours. No results for input(s): "WBC", "RBC", "HCT", "PLT" in the last 72 hours. No results for input(s): "NA", "K", "CL", "CO2", "BUN", "CREATININE", "GLUCOSE", "CALCIUM" in the last 72 hours. No results for input(s): "LABPT", "INR" in the last 72 hours.  EXAM: General - Patient is Alert and Oriented Extremity - Neurovascular intact Sensation intact distally Dorsiflexion/Plantar flexion intact Compartment soft Incision - clean, dry, with drainage on the surgical bandage.  The Hemovac was removed with no complication. Motor Function -plantarflexion and dorsiflexion are intact.  Able to straight leg raise independently.  Assessment/Plan: 1 Day Post-Op Procedure(s) (LRB): COMPUTER ASSISTED TOTAL KNEE ARTHROPLASTY (Right) Procedure(s)  (LRB): COMPUTER ASSISTED TOTAL KNEE ARTHROPLASTY (Right) Past Medical History:  Diagnosis Date   CKD (chronic kidney disease), stage III    Degenerative arthritis of left knee    Depression    GERD (gastroesophageal reflux disease)    HLD (hyperlipidemia)    Hypertension    OA (osteoarthritis)    Panic attack    Skin cancer 2005   forehead, neck, left ankle   Vitamin D deficiency    Principal Problem:   Total knee replacement status  Estimated body mass index is 27.18 kg/m as calculated from the following:   Height as of this encounter: 5' 6.5" (1.689 m).   Weight as of this encounter: 77.6 kg. Advance diet Up with therapy D/C IV fluids Discharge home with home health Diet - Regular diet Follow up - in 2 weeks Activity - WBAT Disposition - Home Condition Upon Discharge - Good DVT Prophylaxis - Lovenox and TED hose  Reche Dixon, PA-C Orthopaedic Surgery 12/08/2022, 7:17 AM

## 2022-12-12 ENCOUNTER — Emergency Department
Admission: EM | Admit: 2022-12-12 | Discharge: 2022-12-12 | Disposition: A | Discharge status: Home / Self Care | Payer: Medicare Other | Attending: Emergency Medicine | Admitting: Emergency Medicine

## 2022-12-12 ENCOUNTER — Emergency Department: Payer: Medicare Other

## 2022-12-12 DIAGNOSIS — Y9301 Activity, walking, marching and hiking: Secondary | ICD-10-CM | POA: Diagnosis not present

## 2022-12-12 DIAGNOSIS — Z85828 Personal history of other malignant neoplasm of skin: Secondary | ICD-10-CM | POA: Diagnosis not present

## 2022-12-12 DIAGNOSIS — M25561 Pain in right knee: Secondary | ICD-10-CM | POA: Insufficient documentation

## 2022-12-12 DIAGNOSIS — N183 Chronic kidney disease, stage 3 unspecified: Secondary | ICD-10-CM | POA: Diagnosis not present

## 2022-12-12 DIAGNOSIS — M25521 Pain in right elbow: Secondary | ICD-10-CM | POA: Diagnosis present

## 2022-12-12 DIAGNOSIS — S50311A Abrasion of right elbow, initial encounter: Secondary | ICD-10-CM | POA: Insufficient documentation

## 2022-12-12 DIAGNOSIS — Z96651 Presence of right artificial knee joint: Secondary | ICD-10-CM | POA: Insufficient documentation

## 2022-12-12 DIAGNOSIS — W1830XA Fall on same level, unspecified, initial encounter: Secondary | ICD-10-CM | POA: Diagnosis not present

## 2022-12-12 DIAGNOSIS — I129 Hypertensive chronic kidney disease with stage 1 through stage 4 chronic kidney disease, or unspecified chronic kidney disease: Secondary | ICD-10-CM | POA: Diagnosis not present

## 2022-12-12 DIAGNOSIS — W19XXXA Unspecified fall, initial encounter: Secondary | ICD-10-CM

## 2022-12-12 MED ORDER — OXYCODONE-ACETAMINOPHEN 5-325 MG PO TABS
1.0000 | ORAL_TABLET | Freq: Once | ORAL | Status: AC
  Administered 2022-12-12: 1 via ORAL
  Filled 2022-12-12: qty 1

## 2022-12-12 NOTE — ED Notes (Signed)
32 Staples intact to Left knee incision. Incision intact, no open areas

## 2022-12-12 NOTE — Discharge Instructions (Signed)
Your x-ray did not show any broken bone or issue with the surgical hardware.  Continue to take your pain medicine as prescribed.  If you have any increasing pain or any new pain that you are concerned about or develop headache vomiting or confusion then please return to the emergency department.

## 2022-12-12 NOTE — ED Notes (Signed)
Incision to R knee cleansed and telfa nonstick dressing applied

## 2022-12-12 NOTE — ED Provider Notes (Signed)
[facility] Provider Note    Event Date/Time   First MD Initiated Contact with Patient 12/12/22 [phone]     (approximate)   History   Knee Pain (Post op surgery to R knee last week. Had fall walking to the kitchen and landed on the R knee. Felt like a staples popped out upon falling. Dressing was bloody upon arrival to ED. R leg is swollen) and Fall   HPI  Tabitha Gill is a 77 y.o. female past medical history of CKD, osteoarthritis, recent total knee arthroplasty on 4/3 who presents after a fall.  Patient was walking in the kitchen when she lost balance and fell directly onto the right knee.  Was unable to get up on her own.  Felt something pop.  Her TKA dressing was then saturated with blood.  Denies any head injury.  Did hit the right elbow and has some associated pain there.  Denies any other injury.  No neck pain numbness or tingling.  She is currently on Lovenox shots.     Past Medical History:  Diagnosis Date   CKD (chronic kidney disease), stage III    Degenerative arthritis of left knee    Depression    GERD (gastroesophageal reflux disease)    HLD (hyperlipidemia)    Hypertension    OA (osteoarthritis)    Panic attack    Skin cancer 2005   forehead, neck, left ankle   Vitamin D deficiency     Patient Active Problem List   Diagnosis Date Noted   Total knee replacement status 12/07/2022   Primary osteoarthritis of right knee 09/21/2022   Anxiety 09/20/2021   Depression 09/20/2021   Hyperlipidemia 09/20/2021   Hypertension 09/20/2021   Menopausal symptom 09/20/2021   Status post total left knee replacement 09/20/2021   CKD (chronic kidney disease) stage 3, GFR 30-59 ml/min 11/29/2017   GERD (gastroesophageal reflux disease) 10/04/2016   Vitamin D deficiency 10/16/2015     Physical Exam  Triage Vital Signs: ED Triage Vitals  Enc Vitals Group     BP      Pulse      Resp      Temp      Temp src      SpO2      Weight      Height       Head Circumference      Peak Flow      Pain Score      Pain Loc      Pain Edu?      Excl. in GC?     Most recent vital signs: Vitals:   12/12/22 0502 12/12/22 0503  BP: 130/65   Pulse: (!) 101   Resp:  18  Temp: 98 F (36.7 C)   SpO2: 99%      General: Awake, no distress.  CV:  Good peripheral perfusion.  Resp:  Normal effort.  Abd:  No distention.  Neuro:             Awake, Alert, Oriented x 3  Other:  Right knee has dressing in place with blood soaked on the inferior portion, dressing removed which reveals all the staples intact without surrounding erythema, there is 1 area near the inferior portion of the wound that looks to be the source of bleeding but is hemostatic currently There is swelling of mid thigh to calf but compartments soft Patient is able to straight leg raise 2+ DP pulse bilateral lower  extremities Intact plantarflexion dorsiflexion  Small abrasion over the right elbow but patient is able to flex and extend no focal tenderness or deformity No C-spine tenderness Face symmetric equal strength bilateral upper and lower extremities no signs of trauma to the head   ED Results / Procedures / Treatments  Labs (all labs ordered are listed, but only abnormal results are displayed) Labs Reviewed - No data to display   EKG    RADIOLOGY I reviewed interpreted the x-ray of the right knee which shows no acute fracture or hardware displacement   PROCEDURES:  Critical Care performed: No  Procedures  The patient is on the cardiac monitor to evaluate for evidence of arrhythmia and/or significant heart rate changes.   MEDICATIONS ORDERED IN ED: Medications  oxyCODONE-acetaminophen (PERCOCET/ROXICET) 5-325 MG per tablet 1 tablet (1 tablet Oral Given 12/12/22 3202)     IMPRESSION / MDM / ASSESSMENT AND PLAN / ED COURSE  I reviewed the triage vital signs and the nursing notes.                              Patient's presentation is most consistent  with acute complicated illness / injury requiring diagnostic workup.  Differential diagnosis includes, but is not limited to, wound dehiscence, periprosthetic fracture, hardware displacement  Patient is a 77 year old female status post recent TKA on 4/3 presents after mechanical fall onto the right knee this evening.  She had been doing well at home but was walking this evening lost her balance and fell hitting the right knee and right elbow.  Did not hit her head was unable to get up on her own.  She then noticed that the dressing in place over the TKA wound was saturated with blood.  On arrival the dressing does have blood on the inferior portion.  Dressing was removed to reveal intact sutures without signs of dehiscence no active bleeding.  Patient able to straight leg raise neurovascularly intact.  Other than abrasion to the right elbow no other signs of trauma.  Plan to obtain an x-ray to evaluate for periprosthetic fracture.  X-rays negative for any acute injury.  Dressing applied to patient's surgical vision.  Patient appropriate for discharge.       FINAL CLINICAL IMPRESSION(S) / ED DIAGNOSES   Final diagnoses:  Fall, initial encounter     Rx / DC Orders   ED Discharge Orders     None        Note:  This document was prepared using Dragon voice recognition software and may include unintentional dictation errors.   Georga Hacking, MD 12/12/22 [phone]

## 2022-12-14 ENCOUNTER — Encounter: Payer: Self-pay | Admitting: Orthopedic Surgery

## 2022-12-27 ENCOUNTER — Encounter: Payer: Self-pay | Admitting: Orthopedic Surgery

## 2023-09-05 IMAGING — DX DG KNEE 1-2V PORT*L*
2 series · 2 of 2 positions shown · non-contrast
Comparison: None.

CLINICAL DATA: Left total knee replacement

EXAM:
PORTABLE LEFT KNEE - 1-2 VIEW

[knee ap]
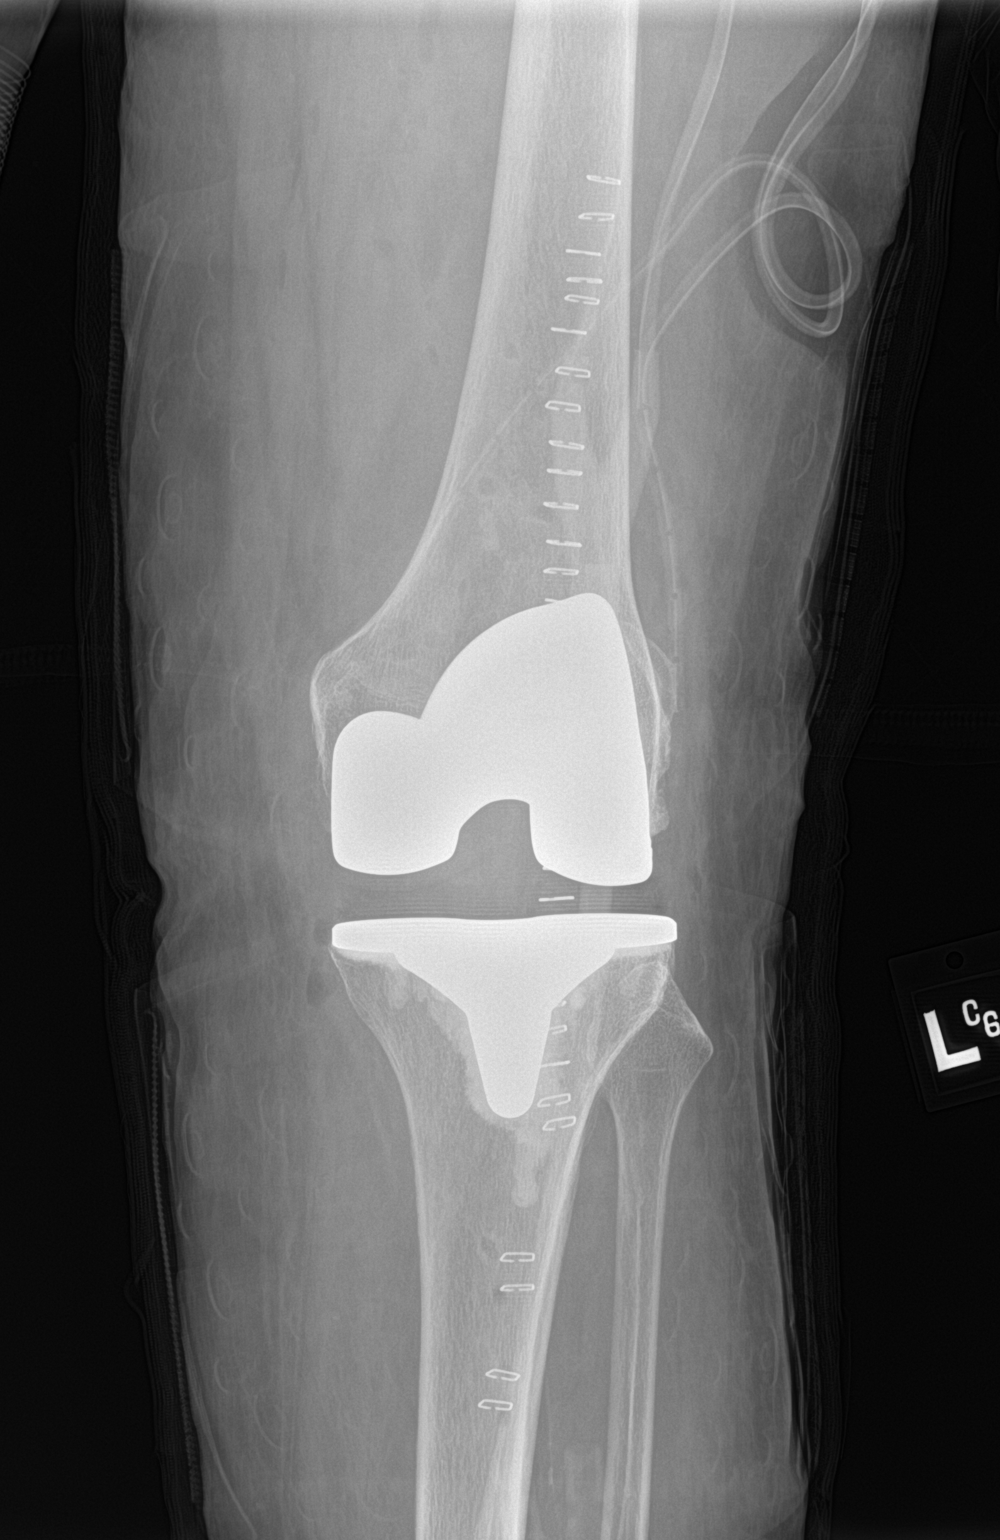

[knee lat]
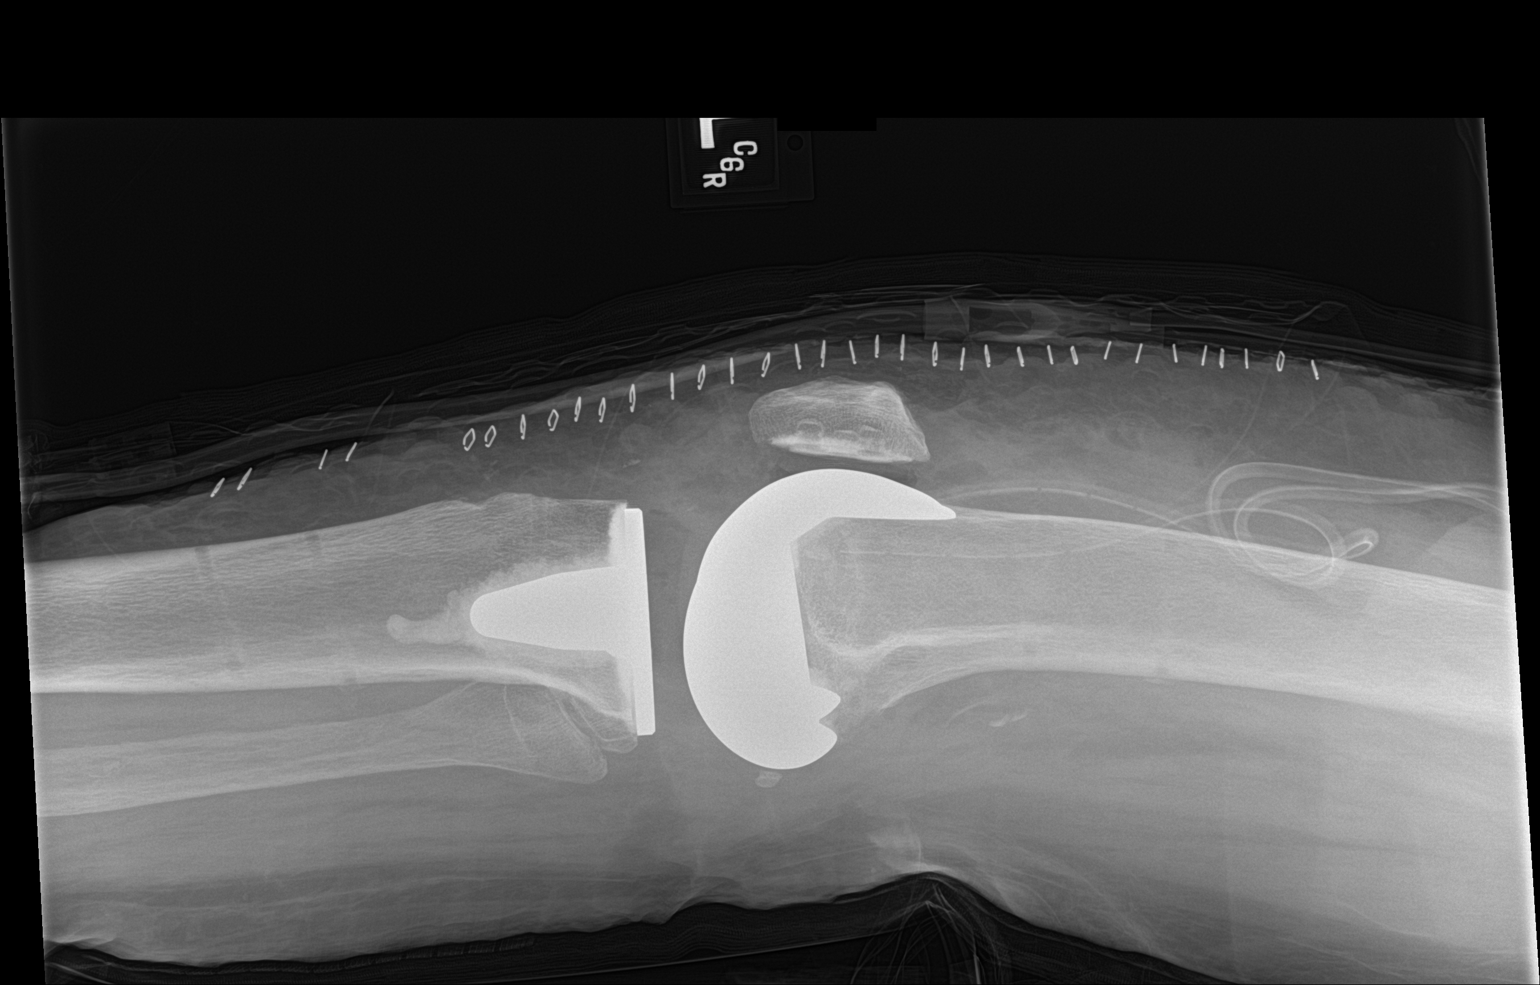

[2 of 2 positions shown; findings below may reference images not displayed]

FINDINGS: Status post total knee replacement. The prosthetic components appear
to be in near anatomic alignment. No perihardware lucency or
fracture. Ghost tracts from prior fixation hardware are noted in the
femur and tibia. Air within the soft tissues is not unexpected post
surgically. Surgical skin staples. A drain is noted.
IMPRESSION: Expected postoperative appearance, status post left total knee
arthroplasty.

## 2024-05-17 ENCOUNTER — Encounter: Payer: Self-pay | Admitting: Intensive Care

## 2024-05-17 ENCOUNTER — Emergency Department

## 2024-05-17 ENCOUNTER — Emergency Department
Admission: EM | Admit: 2024-05-17 | Discharge: 2024-05-17 | Disposition: A | Discharge status: Home / Self Care | Attending: Emergency Medicine | Admitting: Emergency Medicine

## 2024-05-17 ENCOUNTER — Other Ambulatory Visit: Payer: Self-pay

## 2024-05-17 DIAGNOSIS — Y92009 Unspecified place in unspecified non-institutional (private) residence as the place of occurrence of the external cause: Secondary | ICD-10-CM | POA: Diagnosis not present

## 2024-05-17 DIAGNOSIS — N183 Chronic kidney disease, stage 3 unspecified: Secondary | ICD-10-CM | POA: Diagnosis not present

## 2024-05-17 DIAGNOSIS — S0993XA Unspecified injury of face, initial encounter: Secondary | ICD-10-CM | POA: Diagnosis present

## 2024-05-17 DIAGNOSIS — W01198A Fall on same level from slipping, tripping and stumbling with subsequent striking against other object, initial encounter: Secondary | ICD-10-CM | POA: Insufficient documentation

## 2024-05-17 DIAGNOSIS — S0083XA Contusion of other part of head, initial encounter: Secondary | ICD-10-CM | POA: Insufficient documentation

## 2024-05-17 DIAGNOSIS — I129 Hypertensive chronic kidney disease with stage 1 through stage 4 chronic kidney disease, or unspecified chronic kidney disease: Secondary | ICD-10-CM | POA: Insufficient documentation

## 2024-05-17 NOTE — ED Provider Notes (Signed)
 [facility] Provider Note    Event Date/Time   First MD Initiated Contact with Patient 05/17/24 [id number]     (approximate)   History   Fall   HPI  Tabitha Gill is a 78 y.o. female   is brought to the ED by son for evaluation after patient had a fall several days ago and now has bruising to her face.  She states that she tripped due to a blanket around her feet and also was going in between 2 chairs.  She states she hit her face on a dresser but denies any loss of consciousness.  There is continued to be no visual changes, nausea, vomiting, dizziness.  Patient has had some swelling periorbitally but no visual changes.  She denies any cervical pain and has been ambulatory with her cane without any assistance.  Patient has a history of CKD stage III, hypertension, osteoarthritis right knee, GERD, depression/anxiety.      Physical Exam   Triage Vital Signs: ED Triage Vitals  Encounter Vitals Group     BP 05/17/24 0821 (!) 150/75     Girls Systolic BP Percentile --      Girls Diastolic BP Percentile --      Boys Systolic BP Percentile --      Boys Diastolic BP Percentile --      Pulse Rate 05/17/24 0821 84     Resp 05/17/24 0821 15     Temp 05/17/24 0821 97.9 F (36.6 C)     Temp Source 05/17/24 0821 Oral     SpO2 05/17/24 0821 91 %     Weight 05/17/24 0822 179 lb (81.2 kg)     Height 05/17/24 0822 5' 6 (1.676 m)     Head Circumference --      Peak Flow --      Pain Score 05/17/24 0822 6     Pain Loc --      Pain Education --      Exclude from Growth Chart --     Most recent vital signs: Vitals:   05/17/24 0821 05/17/24 0948  BP: (!) 150/75 (!) 142/63  Pulse: 84 66  Resp: 15 15  Temp: 97.9 F (36.6 C) 98.4 F (36.9 C)  SpO2: 91% 94%     General: Awake, no distress.  Alert, talkative, cooperative.  Speech is normal and able to answer questions appropriately. CV:  Good peripheral perfusion.  Heart regular rate rhythm. Resp:  Normal  effort.  Lungs are clear bilaterally.  No tenderness noted on palpation of the ribs bilaterally. Abd:  No distention.  Soft, nontender, bowel sounds normoactive x 4 quadrants. Other:  Patient is able to move upper and lower extremities without any difficulty.  She is ambulatory while in the ED with the use of her cane.  She was able to get from a supine position to standing with little effort.  There is some soft tissue edema and ecchymosis in the bilateral periorbital area.  PERRLA, EOMI's, no evidence of entrapment, no hyphema.  There is some bruising noted to the forehead in various stages of healing especially on the right upper forehead.  Skin is intact.  No tenderness is noted on palpation of cervical spine posteriorly.  Patient is able move upper and lower extremities without any difficulty.  No tenderness on palpation of the thoracic or lumbar spine.   ED Results / Procedures / Treatments   Labs (all labs ordered are listed, but only abnormal results  are displayed) Labs Reviewed - No data to display   RADIOLOGY CT head without contrast per radiology is negative for acute intracranial injury. Cervical spine CT images were reviewed by the radiologist with degenerative changes noted but no acute trauma related injury. CT maxillofacial per radiology shows soft tissue edema but no fractures.   PROCEDURES:  Critical Care performed:   Procedures   MEDICATIONS ORDERED IN ED: Medications - No data to display   IMPRESSION / MDM / ASSESSMENT AND PLAN / ED COURSE  I reviewed the triage vital signs and the nursing notes.   Differential diagnosis includes, but is not limited to, head injury, contusion, skull fracture, maxillofacial fracture, soft tissue edema, cervical fracture, subluxation, cervical sprain.  78 year old female is brought to the ED by family after a fall that occurred 2 days ago in which she hit her face on a dresser.  This was mechanical fall and patient has continued  to ambulate without any assistance other than her cane which is usual for her.  Patient has moderate amount of soft tissue edema and ecchymosis periorbital area bilaterally but no visual changes.  CT head, cervical spine and maxillofacial are all negative for acute traumatic changes.  Patient and son were reassured.  She has follow-up with her PCP if any continued problems.  She is aware that the soft tissue edema and bruising will take some time to improve.  She is continue with her regular medications and follow-up with her PCP if any continued problems.      Patient's presentation is most consistent with acute presentation with potential threat to life or bodily function.  FINAL CLINICAL IMPRESSION(S) / ED DIAGNOSES   Final diagnoses:  Contusion of face, initial encounter  Fall in home, initial encounter     Rx / DC Orders   ED Discharge Orders     None        Note:  This document was prepared using Dragon voice recognition software and may include unintentional dictation errors.   Saunders Shona CROME, PA-C 05/17/24 1420    Arlander Charleston, MD 05/17/24 1447

## 2024-05-17 NOTE — ED Notes (Signed)
 Family at bedside Yellow band and door sign applied  Bed alarm not applied

## 2024-05-17 NOTE — ED Notes (Signed)
 See triage note Presents with family s/p trip and fall.  States she got her feet tangled up   States she fell between 2 recliners   Hitting her face  Bruising/swelling noted to both eyes  Denies any LOC

## 2024-05-17 NOTE — ED Triage Notes (Signed)
 Patient tripped Friday on a blanket and hit her face/forehead on dresser. Bilateral eyes are bruised and some swelling.   Denies blood thinners. Denies vision changes   A&O x4 in triage

## 2024-05-17 NOTE — Discharge Instructions (Addendum)
 Follow-up with your primary care provider if any continued problems or concerns.  CT scans of your face, head and cervical spine were all negative for acute injury, bleeding or fracture.  It may take 10 to 14 days for the bruising to your face to completely disappear.  Return to the emergency department if any severe worsening of your symptoms or urgent concerns such as dizziness, nausea, vomiting, visual changes.  You may take Tylenol  if needed for pain.  Also ice pack a few times a day may decrease the amount of swelling in your face.
